# Patient Record
Sex: Male | Born: 1970
Health system: Southern US, Community
[De-identification: ages and names within clinical notes are randomized; demographics above are authoritative.]

## PROBLEM LIST (undated history)

## (undated) DIAGNOSIS — G8929 Other chronic pain: Secondary | ICD-10-CM

## (undated) DIAGNOSIS — R5382 Chronic fatigue, unspecified: Secondary | ICD-10-CM

## (undated) DIAGNOSIS — F419 Anxiety disorder, unspecified: Secondary | ICD-10-CM

## (undated) DIAGNOSIS — I10 Essential (primary) hypertension: Secondary | ICD-10-CM

## (undated) DIAGNOSIS — G4730 Sleep apnea, unspecified: Secondary | ICD-10-CM

## (undated) DIAGNOSIS — R000 Tachycardia, unspecified: Secondary | ICD-10-CM

## (undated) DIAGNOSIS — M5116 Intervertebral disc disorders with radiculopathy, lumbar region: Secondary | ICD-10-CM

## (undated) DIAGNOSIS — Z87442 Personal history of urinary calculi: Secondary | ICD-10-CM

## (undated) DIAGNOSIS — G43909 Migraine, unspecified, not intractable, without status migrainosus: Secondary | ICD-10-CM

## (undated) DIAGNOSIS — E785 Hyperlipidemia, unspecified: Secondary | ICD-10-CM

## (undated) DIAGNOSIS — F331 Major depressive disorder, recurrent, moderate: Secondary | ICD-10-CM

## (undated) DIAGNOSIS — N35919 Unspecified urethral stricture, male, unspecified site: Secondary | ICD-10-CM

## (undated) HISTORY — DX: Anxiety disorder, unspecified: F41.9

## (undated) HISTORY — DX: Other chronic pain: G89.29

## (undated) HISTORY — DX: Morbid (severe) obesity due to excess calories: E66.01

## (undated) HISTORY — PX: OTHER SURGICAL HISTORY: SHX169

## (undated) HISTORY — DX: Intervertebral disc disorders with radiculopathy, lumbar region: M51.16

## (undated) HISTORY — DX: Major depressive disorder, recurrent, moderate: F33.1

---

## 1998-06-14 ENCOUNTER — Encounter: Admission: RE | Admit: 1998-06-14 | Discharge: 1998-07-03 | Payer: Self-pay | Admitting: Anesthesiology

## 1998-10-10 ENCOUNTER — Encounter: Payer: Self-pay | Admitting: Internal Medicine

## 1998-10-10 ENCOUNTER — Emergency Department (HOSPITAL_COMMUNITY): Admission: EM | Admit: 1998-10-10 | Discharge: 1998-10-10 | Payer: Self-pay | Admitting: Internal Medicine

## 1999-06-13 ENCOUNTER — Inpatient Hospital Stay (HOSPITAL_COMMUNITY): Admission: AD | Admit: 1999-06-13 | Discharge: 1999-06-16 | Payer: Self-pay | Admitting: *Deleted

## 1999-06-17 ENCOUNTER — Other Ambulatory Visit: Admission: RE | Admit: 1999-06-17 | Discharge: 1999-06-26 | Payer: Self-pay

## 1999-07-29 ENCOUNTER — Other Ambulatory Visit: Admission: RE | Admit: 1999-07-29 | Discharge: 1999-07-29 | Payer: Self-pay | Admitting: Family Medicine

## 2008-09-10 HISTORY — PX: LUMBAR FUSION: SHX111

## 2009-01-17 ENCOUNTER — Emergency Department: Payer: Self-pay | Admitting: Unknown Physician Specialty

## 2009-01-21 ENCOUNTER — Emergency Department (HOSPITAL_COMMUNITY): Admission: EM | Admit: 2009-01-21 | Discharge: 2009-01-21 | Payer: Self-pay | Admitting: Emergency Medicine

## 2009-01-23 ENCOUNTER — Encounter: Admission: RE | Admit: 2009-01-23 | Discharge: 2009-01-23 | Payer: Self-pay | Admitting: Neurosurgery

## 2009-02-06 ENCOUNTER — Inpatient Hospital Stay (HOSPITAL_COMMUNITY): Admission: RE | Admit: 2009-02-06 | Discharge: 2009-02-09 | Payer: Self-pay | Admitting: Neurosurgery

## 2009-03-28 ENCOUNTER — Ambulatory Visit: Payer: Self-pay | Admitting: Family Medicine

## 2009-03-28 DIAGNOSIS — F331 Major depressive disorder, recurrent, moderate: Secondary | ICD-10-CM

## 2009-04-23 ENCOUNTER — Ambulatory Visit: Payer: Self-pay | Admitting: Family Medicine

## 2009-04-24 LAB — CONVERTED CEMR LAB
Albumin: 3.7 g/dL (ref 3.5–5.2)
BUN: 15 mg/dL (ref 6–23)
Calcium: 9.1 mg/dL (ref 8.4–10.5)
Cholesterol: 187 mg/dL (ref 0–200)
GFR calc non Af Amer: 79.82 mL/min (ref >60)
Glucose, Bld: 86 mg/dL (ref 70–99)
HDL: 33.2 mg/dL — ABNORMAL LOW (ref >39.00)
Sodium: 140 meq/L (ref 135–145)
VLDL: 26 mg/dL (ref 0.0–40.0)

## 2009-05-29 ENCOUNTER — Ambulatory Visit: Payer: Self-pay | Admitting: Family Medicine

## 2009-05-29 DIAGNOSIS — R221 Localized swelling, mass and lump, neck: Secondary | ICD-10-CM

## 2009-05-29 DIAGNOSIS — R220 Localized swelling, mass and lump, head: Secondary | ICD-10-CM | POA: Insufficient documentation

## 2009-05-31 ENCOUNTER — Ambulatory Visit: Payer: Self-pay | Admitting: Cardiology

## 2009-06-02 ENCOUNTER — Encounter: Payer: Self-pay | Admitting: Family Medicine

## 2009-06-03 ENCOUNTER — Telehealth: Payer: Self-pay | Admitting: Family Medicine

## 2009-07-26 ENCOUNTER — Encounter (INDEPENDENT_AMBULATORY_CARE_PROVIDER_SITE_OTHER): Payer: Self-pay | Admitting: *Deleted

## 2009-11-20 ENCOUNTER — Ambulatory Visit: Payer: Self-pay | Admitting: Family Medicine

## 2010-01-15 ENCOUNTER — Telehealth: Payer: Self-pay | Admitting: Family Medicine

## 2010-01-21 ENCOUNTER — Telehealth: Payer: Self-pay | Admitting: Family Medicine

## 2010-02-14 ENCOUNTER — Encounter: Admission: RE | Admit: 2010-02-14 | Discharge: 2010-02-14 | Payer: Self-pay | Admitting: Neurosurgery

## 2010-05-26 IMAGING — RF DG LUMBAR SPINE 2-3V
1 series · 2 of 2 positions shown · non-contrast
Comparison: 01/24/2007

CLINICAL DATA: L5-S1 fusion

LUMBAR SPINE - 2-3 VIEW

[Series 1: run · 2 of 2 slices shown]
[im 1/2]
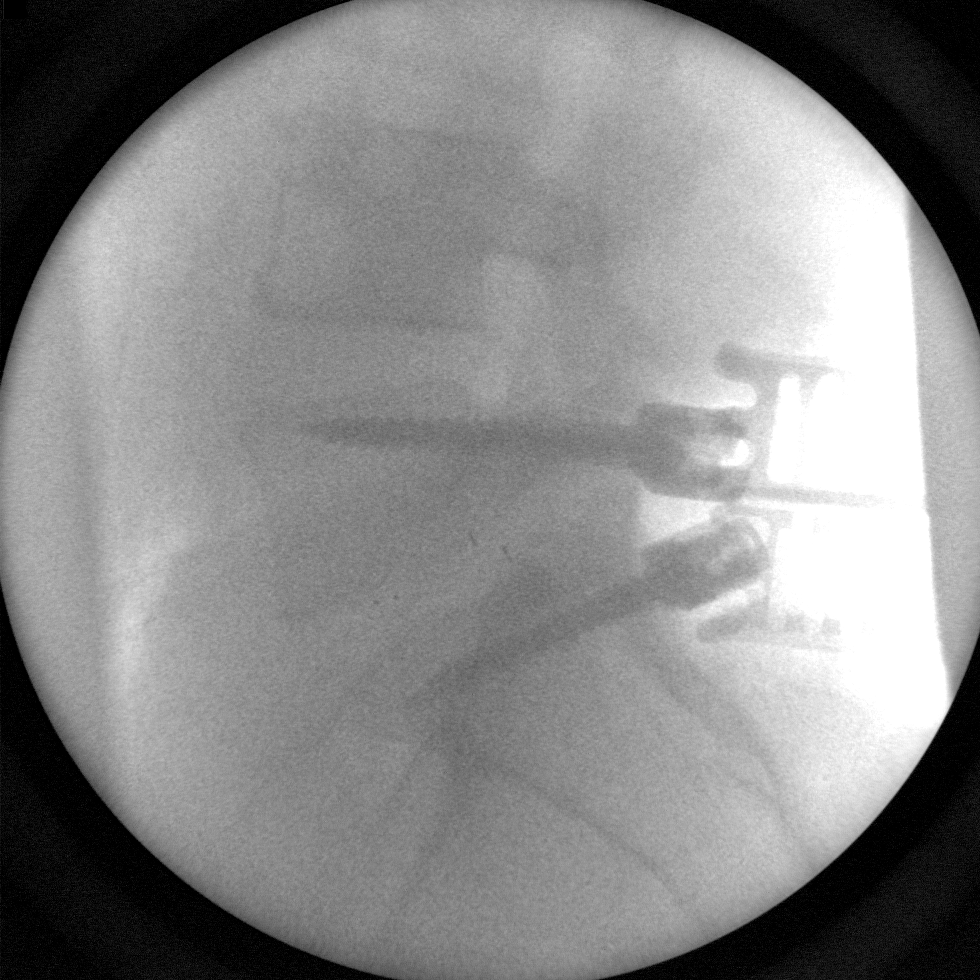
[im 2/2]
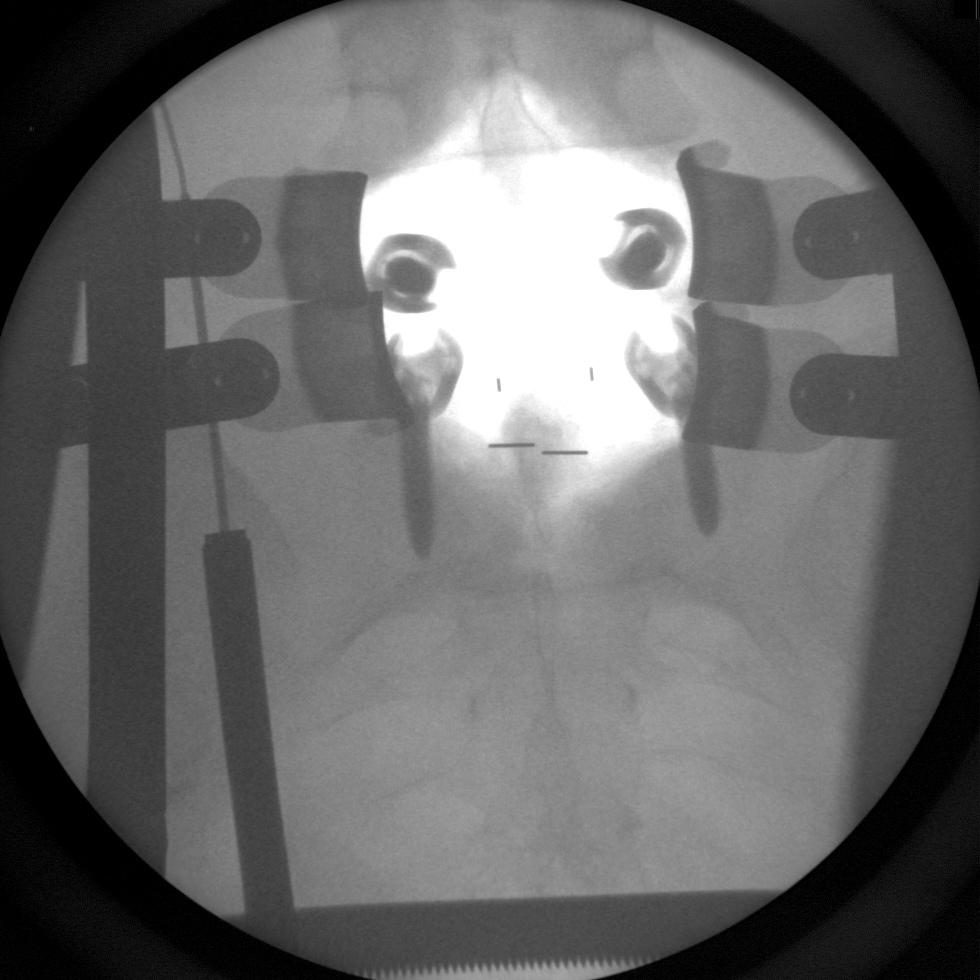

[2 of 2 positions shown; findings below may reference images not displayed]

FINDINGS: Two C-arm images show pedicle screws in place at L5 and
S1.  Interbody fusion material is in place.  Components appear
grossly well positioned.  No detectable complication.
IMPRESSION: PLIF in progress L5-S1.

## 2010-05-26 IMAGING — CR DG LUMBAR SPINE 1V
1 series · 1 of 1 positions shown · non-contrast
Comparison: MRI 01/23/2009

CLINICAL DATA: Localization for spine surgery

LUMBAR SPINE - 1 VIEW

[view not recorded]
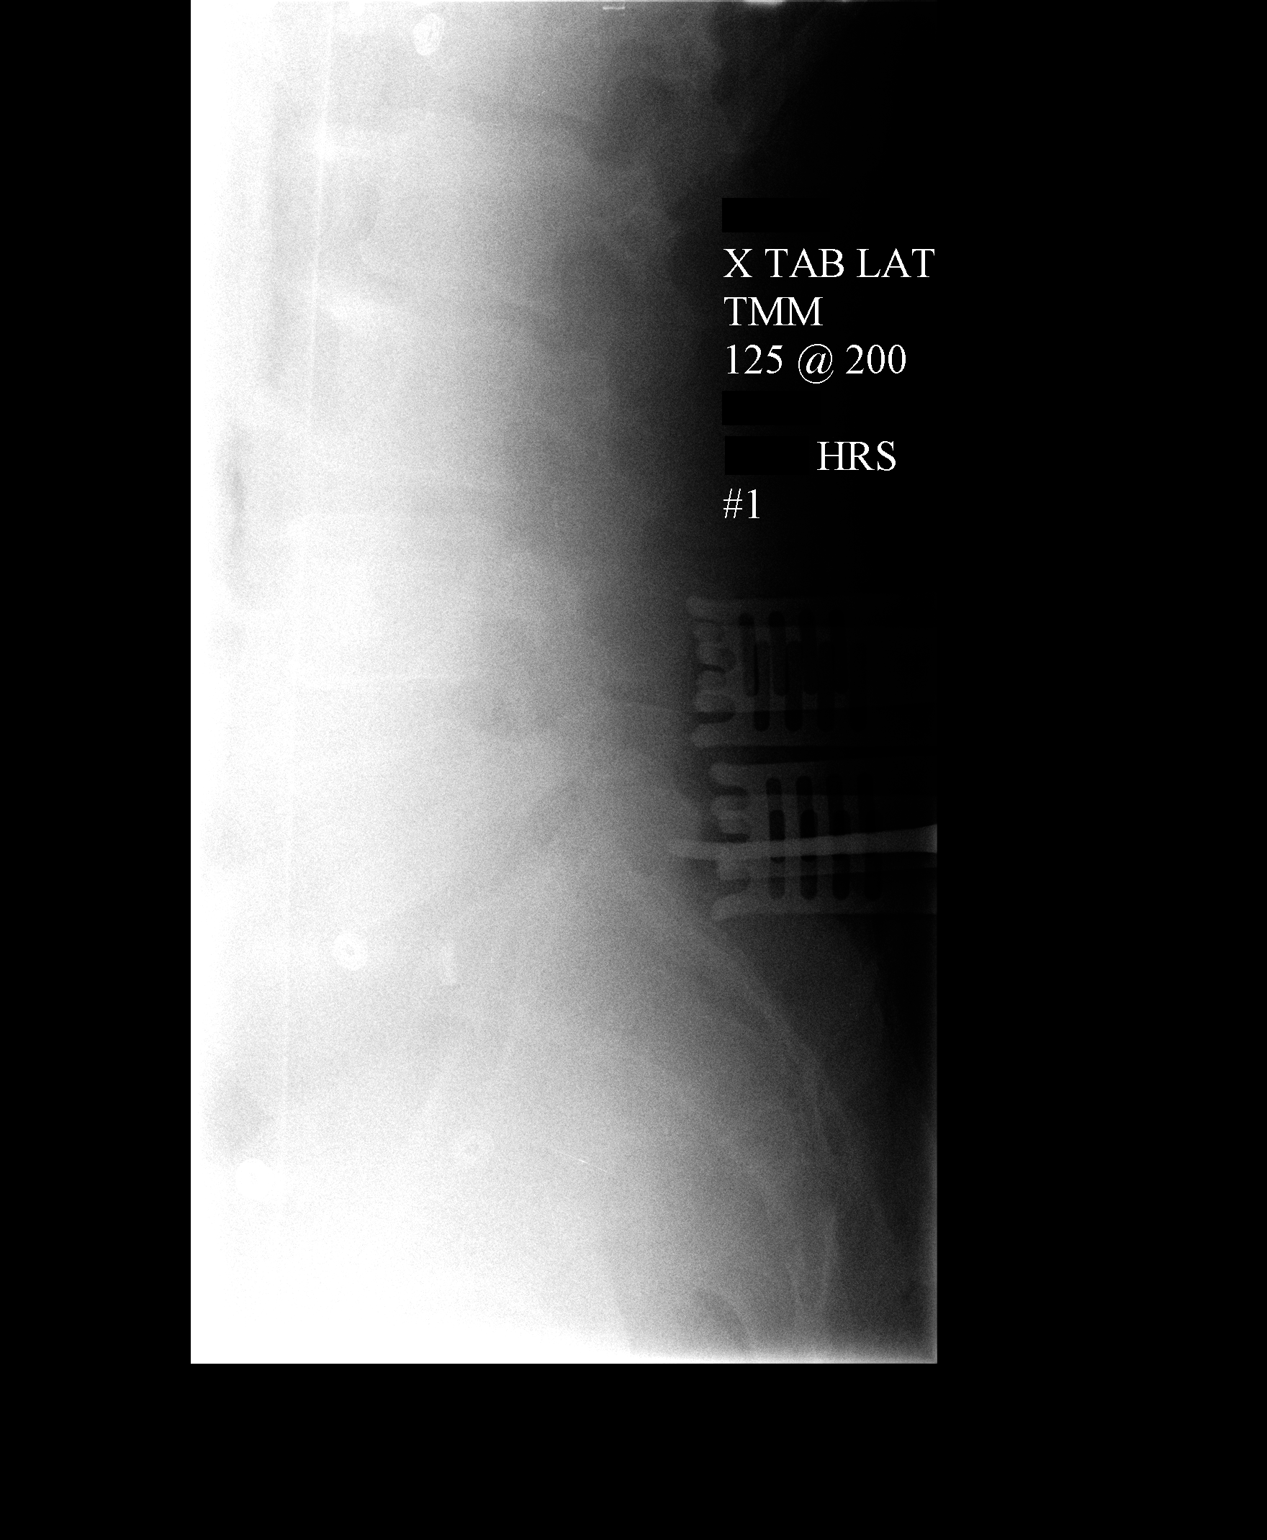

[1 of 1 positions shown; findings below may reference images not displayed]

FINDINGS: There are tissue spreaders in the soft tissues with a
metallic instrument positioned posterior to the neural canal at the
level of L5-S1.
IMPRESSION: L5-S1 localized.

## 2010-07-28 ENCOUNTER — Telehealth: Payer: Self-pay | Admitting: Family Medicine

## 2010-08-04 ENCOUNTER — Encounter: Payer: Self-pay | Admitting: Neurosurgery

## 2010-08-12 NOTE — Letter (Signed)
Summary: [place] No Show Letter  [place] at [facility]  [address] [place], [place] [address]   Phone: [phone]  Fax: [phone]    07/26/2009 MRN: [id number]  Richard Mcguire [address] [place], [place]  [address]   Dear [name],   Our records indicate that you missed your scheduled appointment with ____lab_________________ on ___1.14.11________.  Please contact this office to reschedule your appointment as soon as possible.  It is important that you keep your scheduled appointments with your physician, so we can provide you the best care possible.  Please be advised that there may be a charge for "no show" appointments.    Sincerely,   [place] at [facility]

## 2010-08-12 NOTE — Progress Notes (Signed)
Summary: Rx Paxil  Phone Note Refill Request Call back at [phone] Message from:  [facility] on January 21, 2010 3:22 PM  Refills Requested: Medication #1:  PAXIL 20 MG TABS 1 tab by mouth at bedtime. Received E-script request please advise.   Method Requested: Telephone to Pharmacy Initial call taken by: Linde Gillis CMA Duncan Dull),  January 21, 2010 3:23 PM    Prescriptions: PAXIL 20 MG TABS (PAROXETINE HCL) 1 tab by mouth at bedtime.  #30 x 6   Entered and Authorized by:   Ruthe Mannan MD   Signed by:   Ruthe Mannan MD on 01/22/2010   Method used:   [facility]        [facility] (retail)       [address]       [place], [place]  [address]       Ph: [phone] or [phone]       Fax: [phone]   RxID:   [id number]

## 2010-08-12 NOTE — Progress Notes (Signed)
Summary: Rx Paroxetine  Phone Note Refill Request Call back at [phone] Message from:  [facility] on January 15, 2010 8:41 AM  Refills Requested: Medication #1:  PAXIL 20 MG TABS 1 tab by mouth at bedtime. Received E-script refill request please advise.   Method Requested: Telephone to Pharmacy Initial call taken by: Linde Gillis CMA Duncan Dull),  January 15, 2010 8:43 AM    Prescriptions: PAXIL 20 MG TABS (PAROXETINE HCL) 1 tab by mouth at bedtime.  #30 x 6   Entered and Authorized by:   Ruthe Mannan MD   Signed by:   Ruthe Mannan MD on 01/15/2010   Method used:   [facility]        [facility] [facility]retail)       [address]       [place], [place]  [address]       Ph: [phone] or [phone]       Fax: [phone]   RxID:   [id number]

## 2010-08-12 NOTE — Assessment & Plan Note (Signed)
Summary: discuss diet plans/alc   Vital Signs:  Patient profile:   40 year old male Height:      74 inches Weight:      361.50 pounds BMI:     46.58 Temp:     98.8 degrees F oral Pulse rate:   84 / minute Pulse rhythm:   regular BP sitting:   122 / 88  (left arm) Cuff size:   large  Vitals Entered By: Delilah Shan CMA Duncan Dull) (Nov 20, 2009 10:02 AM) CC: Discuss diet plan   History of Present Illness: 40 yo here to discuss weight loss options.  Weighs the most he has ever weighted (almost 20 pounds than he did last OV in November). Has chronic back issues, neurosurg told him he had to do something about his weight. Over past month, he has started walking on treadmill for 45 minutes 4-5 times per week. Trying to cut back on sodas, limiting portions. Weight NOT coming off.  Getting very discouraged but knows there is no magic pill.  Wants to work hard but if it will yield results.  Current Medications (verified): 1)  Flexeril 10 Mg Tabs (Cyclobenzaprine Hcl) .... Take 1 Tablet By Mouth Three Times A Day 2)  Paxil 20 Mg Tabs (Paroxetine Hcl) .Marland Kitchen.. 1 Tab By Mouth At Bedtime. 3)  Triamcinolone Acetonide 0.1 % Crea (Triamcinolone Acetonide) .... Apply Thin Film To Affected Areas 2-4 Times/day 4)  Multivitamins   Tabs (Multiple Vitamin) .... Take 1 Tablet By Mouth Once A Day 5)  Calcium Citrate-Vitamin D 315-200 Mg-Unit  Tabs (Calcium Citrate-Vitamin D) .... Take 1 Tablet By Mouth Two Times A Day  Allergies (verified): No Known Drug Allergies  Review of Systems      See HPI CV:  Complains of weight gain; denies chest pain or discomfort, difficulty breathing at night, difficulty breathing while lying down, fainting, fatigue, leg cramps with exertion, lightheadness, near fainting, palpitations, shortness of breath with exertion, swelling of feet, and swelling of hands. Psych:  Denies anxiety and depression.  Physical Exam  General:  Well-developed,well-nourished,in no acute  distress; alert,appropriate and cooperative throughout examination, obese Psych:  memory intact for recent and remote, normally interactive, good eye contact, and not anxious appearing.     Impression & Recommendations:  Problem # 1:  MORBID OBESITY (ICD-278.01) Assessment Deteriorated Time spent with patient 25 minutes, more than 50% of this time was spent counseling patient on obesity. He is making very good lifestyle changes. Will refer to surgery for bariatric surgical options.  Orders: Surgical Referral (Surgery)  Complete Medication List: 1)  Flexeril 10 Mg Tabs (Cyclobenzaprine hcl) .... Take 1 tablet by mouth three times a day 2)  Paxil 20 Mg Tabs (Paroxetine hcl) .Marland Kitchen.. 1 tab by mouth at bedtime. 3)  Triamcinolone Acetonide 0.1 % Crea (Triamcinolone acetonide) .... Apply thin film to affected areas 2-4 times/day 4)  Multivitamins Tabs (Multiple vitamin) .... Take 1 tablet by mouth once a day 5)  Calcium Citrate-vitamin D 315-200 Mg-unit Tabs (Calcium citrate-vitamin d) .... Take 1 tablet by mouth two times a day  Patient Instructions: 1)  Please stop by to see Shirlee Limerick on your way out. 2)  Please take Nexium 40 mg daily.  Current Allergies (reviewed today): No known allergies

## 2010-08-14 NOTE — Progress Notes (Signed)
Summary: Paxil refill  Phone Note Refill Request Message from:  [facility] on July 28, 2010 9:42 AM  Refills Requested: Medication #1:  PAXIL 20 MG TABS 1 tab by mouth at bedtime. Recieved request from [facility]. Okay to refill?   Method Requested: Electronic Initial call taken by: Janee Morn CMA Duncan Dull),  July 28, 2010 9:43 AM    Prescriptions: PAXIL 20 MG TABS (PAROXETINE HCL) 1 tab by mouth at bedtime.  #30 x 6   Entered and Authorized by:   Ruthe Mannan MD   Signed by:   Ruthe Mannan MD on 07/28/2010   Method used:   [facility]        [facility] [facility]       [address]       [place], [place]  [address]       Ph: [phone] or [phone]       Fax: [phone]   RxID:   [id number]

## 2010-09-24 ENCOUNTER — Telehealth: Payer: Self-pay | Admitting: Family Medicine

## 2010-09-30 NOTE — Progress Notes (Signed)
Summary: Rx Paxil  Phone Note Refill Request Call back at [phone] Message from:  [facility] on September 24, 2010 11:36 AM  Refills Requested: Medication #1:  PAXIL 20 MG TABS 1 tab by mouth at bedtime.   Last Refilled: 07/28/2010 Received E-script request please advise.   Method Requested: Telephone to Pharmacy Initial call taken by: Linde Gillis CMA Duncan Dull),  September 24, 2010 11:37 AM    Prescriptions: PAXIL 20 MG TABS (PAROXETINE HCL) 1 tab by mouth at bedtime.  #30 x 6   Entered and Authorized by:   Ruthe Mannan MD   Signed by:   Ruthe Mannan MD on 09/24/2010   Method used:   [facility]        [facility]ail)       [address]       [place], [place]  [address]       Ph: [phone] or [phone]       Fax: [phone]   RxID:   [id number]

## 2010-10-19 LAB — TYPE AND SCREEN
ABO/RH(D): A POS
ABO/RH(D): A POS
Antibody Screen: NEGATIVE
Antibody Screen: NEGATIVE

## 2010-10-19 LAB — CBC
HCT: 45 % (ref 39.0–52.0)
Hemoglobin: 15.2 g/dL (ref 13.0–17.0)
MCHC: 33.8 g/dL (ref 30.0–36.0)
MCV: 81.9 fL (ref 78.0–100.0)
Platelets: 199 10*3/uL (ref 150–400)
RBC: 5.49 MIL/uL (ref 4.22–5.81)
RDW: 14 % (ref 11.5–15.5)
WBC: 8.9 10*3/uL (ref 4.0–10.5)

## 2010-10-19 LAB — ABO/RH: ABO/RH(D): A POS

## 2010-11-25 NOTE — Op Note (Signed)
NAMERAYNARD, MAPPS           ACCOUNT NO.:  [id number]   MEDICAL RECORD NO.:  [id number]          PATIENT TYPE:  INP   LOCATION:  3031                         FACILITY:  [facility]   PHYSICIAN:  Hewitt Shorts, M.D.DATE OF BIRTH:  04/04/1971   DATE OF PROCEDURE:  02/06/2009  DATE OF DISCHARGE:                               OPERATIVE REPORT   PREOPERATIVE DIAGNOSES:  Bilateral L5 pars interarticularis defect,  grade 1 spondylolisthesis, bilateral L5-S1 neuroforaminal stenosis, and  broad-based spondylitic L5-S1 lumbar disk herniation.   POSTOPERATIVE DIAGNOSES:  Bilateral L5 pars interarticularis defect,  grade 1 spondylolisthesis, bilateral L5-S1 neuroforaminal stenosis, and  broad-based spondylitic L5-S1 lumbar disk herniation.   PROCEDURE:  L5 Gill procedure with microdissection including  laminectomy, facetectomy, and foraminotomy with decompression of the L5  and S1 nerve roots bilaterally with decompression beyond that required  for interbody arthrodesis, bilateral L5-S1 posterior lumbar interbody  arthrodesis with AVS PEEK interbody implants, and locally harvested  morselized autograft, bilateral L5-S1 posterolateral  arthrodesis with  radius posterior instrumentation, and locally harvested morselized  autograft.   SURGEON:  Hewitt Shorts, MD   ASSISTANTS:  Maeola Harman, M.D..   ANESTHESIA:  General endotracheal.   INDICATIONS:  The patient is a 40 year old man who presented with low  back and bilateral lumbar radicular pain.  He was found to have a broad-  based spondylitic disk herniation at L5-S1 with bilateral L5 pars  interarticularis defect and grade I spondylolisthesis.  Decision was  made to proceed with decompression and stabilization.  Notably, his BMI  is 46.   PROCEDURE:  The patient was brought to the operating room, placed under  general endotracheal anesthesia.  The nursing staff as well as myself  attempted to place a Foley catheter, this was  unsuccessful.  We had to  consult Dr. Jethro Bolus intraoperatively.  He was found to have a  meatal stricture, this was treated by Dr. Patsi Sears and a Foley  catheter was placed by Dr. Patsi Sears.   Once this was completed, the patient was turned to prone position and  then the lumbar region was prepped with Betadine soap and solution and  draped in a sterile fashion.  The midline was infiltrated with local  anesthetic with epinephrine.  A midline incision was made, carried down  through the subcutaneous tissue.  Bipolar electrocautery was used to  maintain hemostasis.  Dissection was carried down to the lumbar fascia,  which was incised bilaterally and the paraspinal muscles were dissected  from the spinous process and lamina in a subperiosteal fashion.  A self-  retaining retractor was placed and x-ray taken and the L5-S1  intralaminar space was identified as was the L5 spinous process and the  lamina.   Using magnification with microdissection and microsurgery technique, we  proceeded with decompression.  Dissection was carried out laterally and  the posterior elements of L5 was found to be loose we began to free it  from the ligamentous attachments.  The L5 facet was drilled away  bilaterally and we were able to progressively mobilize the posterior  element and then it was removed en bloc.  It was subsequently cleaned  off soft tissue and then it was morselized in a bone mill and later used  for the bone graft material both for the interbody fusion and for the  posterolateral arthrodesis.   We then began the decompression of the neuroforamen.  There was  significant pseudoarthrosis bilaterally.  This was removed in a  piecemeal fashion.  We identified the exiting L5 nerve root and  gradually unroofed the spondylitic overgrowth and decompressed the  dorsal aspect of the nerve roots.   We then explored the epidural space coagulating and dividing epidural  veins and  identified the broad-based spondylitic disk herniation at L5-  S1 as well as defining the spondylolisthesis at L5 and S1.   We incised the annulus and entered into the disk space and proceeded  with a diskectomy using variety of microcurettes and pituitary rongeurs.  There was a large spondylitic ridge of the posterior and inferior aspect  of the L5 and extended across the entire posterior aspect of L5.  This  was removed using an osteophyte removal tool and particular care was  taken to remove this ridge laterally within the neuroforamen to  decompress the ventral and inferior aspect of the exiting L5 nerve root  bilaterally.   We then began to prepare the interbody space for arthrodesis.  There was  significant spondylitic degeneration within the disk space.  The disk  material was removed.  We then used paddle curettes to remove the  cartilaginous endplate surfaces and we sized the space with disk space  sizers and selected a 24 mm depth, 10 mm height, 4 degree of lordosis  AVS PEEK interbody implant.  The local bone graft that we had harvested  was morselized in the bone mill and then packed into the AVS implant.  We first placed the implant on the right side, gently retracted the  thecal sac and nerve root.  It was tamped into position and countersunk  and then went to the left side.  Placed the second implant and again  retracted the thecal sac and nerve root and gently tamped into position.  We then took additional morselized autograft and packed it in the  interbody space lateral to each of the implants.   The C-arm fluoroscope was then brought into the field to provide  guidance and placement of the pedicle screws.  We identified the L5 and  S1 pedicle entry sites.  Each of the 4 pedicles was probed using C-arm  fluoroscopic guidance.  Exam with a ball probe, good bony surface was  found.  Each of the pedicles were tapped with a 5.25-mm tap.  Again was  examined with a ball  probe, good threading was noted, good bony surfaces  were noted, and then we placed 5.75-mm screws at each level using 50-mm  screws at L5 and 40-mm screws at S1.   We then selected prelordosed rods using a 35-mm rod on the right and 30-  mm rod on the left.  The rods were placed within the screw heads and  locking caps placed.  Once all the 4 locking caps were in place, final  tightening was done using a counter torque.  We then packed the  morselized autograft in the lateral gutters overlying the facets and  transverse process and ala, which will be decorticated and then the  wound was irrigated once again with Bacitracin solution, checked for  hemostasis which was established and confirmed, and then proceeded with  closure.  Deep fascia was closed with interrupted undyed #1 Vicryl  sutures.  Scarpa fascia was closed with interrupted undyed #1 Vicryl  sutures.  The subcutaneous and subcuticular were closed with interrupted  inverted 2-0 undyed Vicryl sutures.  The upper edge of the incision was  closed with 3-0 nylon interrupted horizontal mattress sutures and the  remainder of the incision was closed with Dermabond.  The wound was  dressed with Adaptic and sterile gauze.  Procedure was  tolerated well.  The estimated blood loss was 600 mL and we were able to  return 230 mL of Cell Saver blood to the patient.  Sponge and needle  count were correct.  Following surgery, the patient was turned back to  the supine position, to be reversed from the anesthetic, extubated, and  transferred to the recovery room for further care.      Hewitt Shorts, M.D.  Electronically Signed     RWN/MEDQ  D:  02/06/2009  T:  02/07/2009  Job:  [id number]

## 2010-11-25 NOTE — Op Note (Signed)
NAMETERRY, BOLOTIN           ACCOUNT NO.:  [id number]   MEDICAL RECORD NO.:  [id number]          PATIENT TYPE:  INP   LOCATION:  3031                         FACILITY:  [facility]   PHYSICIAN:  Sigmund I. Patsi Sears, M.D.DATE OF BIRTH:  11-20-1970   DATE OF PROCEDURE:  02/06/2009  DATE OF DISCHARGE:                               OPERATIVE REPORT   PREOPERATIVE DIAGNOSIS:  Urethral meatal stricture.   POSTOPERATIVE DIAGNOSIS:  Urethral meatal stricture.   OPERATION:  Urethral dilation, a 12-French Foley catheter insertion.   SURGEON:  Sigmund I. Patsi Sears, MD   ANESTHESIA:  General endotracheal.   PROCEDURE:  With the patient in the supine position under general  anesthesia, intraoperative Urology consultation was required because  Foley catheter could not be inserted.  Inspection of the penile meatus  showed that the patient had circumferential meatal penile scarring, and  evidence of prior meatoplasty (? as a child).  A 14-French or 12-French  catheter could not be passed into the meatus.  Therefore, urethral  meatal dilation was accomplished to a size 16-French with the [employer]  sound.  Following this, a 12-French Foley catheter was easily passed  into the urethral meatus and into the bladder.  Clear urine was  obtained.  There was some bleeding noted at the level of the submeatus  of the urethra.  It was felt to be inconsequential.  The patient will be  followed up with full consultation postoperatively.      Sigmund I. Patsi Sears, M.D.  Electronically Signed     SIT/MEDQ  D:  02/06/2009  T:  02/06/2009  Job:  [id number]   cc:   Hewitt Shorts, M.D.

## 2010-11-25 NOTE — H&P (Signed)
Richard Mcguire, Richard Mcguire NO.:  [id number]   MEDICAL RECORD NO.:  [id number]          PATIENT TYPE:  INP   LOCATION:  3031                         FACILITY:  [facility]   PHYSICIAN:  Hewitt Shorts, M.D.DATE OF BIRTH:  02/21/1971   DATE OF ADMISSION:  02/06/2009  DATE OF DISCHARGE:                              HISTORY & PHYSICAL   HISTORY OF PRESENT ILLNESS:  The patient is a 40 year old right-handed  white male who is evaluated for disabling low back and right lumbar  radicular pain into the buttock and thigh with numbness in his toes.  He  explains to have his symptoms began January 19, 2009, he was climbing into  the cab of his truck and sneezed.  He was seen in the emergency room at  [facility], subsequently in the [facility] Emergency Room, at that point referred to our office.  He was  treated now at [facility] with Vicodin and Flexeril and at [facility] with Valium and Percocet with a 6-day prednisone dose pack.  X-  rays have been done at the [facility] Emergency Room, but no additional  workup.  We subsequently evaluated the patient with MRI scan, which  shows bilateral L5 pars interarticularis defect with a grade 1  spondylolisthesis of the L5 and S1 with associated pseudoarthritis and  broad-based spondylotic disk protrusion and bilateral neuroforaminal  stenosis.  Because of the persistent and disabling pain, decision was  made to proceed with decompression and arthrodesis, and the patient is  admitted for such.   PAST MEDICAL HISTORY:  The patient did not describe a history for  hypertension, myocardial infarction, cancer, stroke, diabetes, peptic  ulcer disease or lung disease.   PREVIOUS SURGERIES:  Pilonidal cyst in 1990.   MEDICATIONS:  His only medications are those prescribed by the emergency  room and he denies allergies to medications.   FAMILY HISTORY:  Parents are both living.  His mother is age 83 in  good  health with chronic back pain.  Father is age 65 in good health.   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  He  smokes a pack a day.  He drinks alcoholic beverages.  Socially, he  denies history of substance abuse.   REVIEW OF SYSTEMS:  Notable for those described in his history of  present illness and past medical history,  but is otherwise  unremarkable.   PHYSICAL EXAMINATION:  GENERAL:  The patient is a morbidly obese, white  male in obvious discomfort, but in no acute distress.  VITAL SIGNS:  Height is 6 feet, weight 240 pounds for BMI of 45.  Temperature 98.1, pulse 72, blood pressure 133/88, respiratory rate 20.  LUNGS:  Clear to auscultation.  He has symmetrical respiratory  excursion.  HEART:  Regular rate and rhythm.  Normal S1 and S2.  There is no murmur.  EXTREMITIES:  Moderate clubbing, but no cyanosis or edema.  MUSCULOSKELETAL:  Significant limitation in mobility due to discomfort  pain in the low back.  Flexion is limited about 20 degrees and  is unable  to extend to any significant extent.  Tenderness diffusely in the lumbar  region particularly to the right side but without specific point  tenderness.  NEUROLOGIC:  A 5/5 strength in the lower extremities including the  iliopsoas, quadriceps, dorsiflexors, extensor hallucis longus, and  plantar flexor bilaterally.  He tends to give way although on testing of  the musculature on the right side, sensation is intact to pinprick to  the upper and lower extremities.  Reflexes are 1 at the quadriceps,  minimal in the gastrocnemius, symmetrical bilaterally.  Toes are  downgoing bilaterally.  His gait exams both favor the right lower  extremity due to pain.  He has a positive straight leg raising on the  right and negative on the left.   IMPRESSION:  Incapacitating low back and bilateral lower extremity pain  which is improved somewhat with prednisone dose pack.  The patient's  bilateral  L5 pars interarticularis defect with broad-based spondylotic  disk herniation and bilateral neuroforaminal stenosis and significant  pseudoarthrosis with grade 1 spondylolisthesis at L5 and S1.  He has had  symptoms dating back as much as 11 years ago with acute symptoms  developing earlier this month.   PLAN:  The patient admitted for L5 Gill procedure in bilateral L5-S1,  posterior lumbar interbody fusion with interbody implants and bone graft  and bilateral L5-S1 posterolateral arthrodesis with posterior  instrumentation and bone graft.  We discussed alternatives to surgery  and nature of the surgical procedure itself, typical length of the  surgery, hospital stay, overall occupational limitations  postoperatively, need for postoperative immobilization and lumbar brace  and risks including risks of infection, bleeding, possible need for  transfusion, risk of nerve dysfunction with pain, weakness, numbness, or  paresthesias, the risk of dural tear and CSF leakage, possible need for  further surgery, the risk of failure of the arthrodesis and anesthetic  risk of myocardial infarction, stroke, pneumonia, and death.  He was  strongly advised to quit smoking and to work on weight loss.   Understanding all this, he did wish to proceed with surgery and was  admitted for such.      Hewitt Shorts, M.D.  Electronically Signed     RWN/MEDQ  D:  02/06/2009  T:  02/07/2009  Job:  [id number]

## 2010-11-25 NOTE — Discharge Summary (Signed)
Richard Mcguire, Richard Mcguire           ACCOUNT NO.:  [id number]   MEDICAL RECORD NO.:  [id number]          PATIENT TYPE:  INP   LOCATION:  3031                         FACILITY:  [facility]   PHYSICIAN:  Hilda Lias, M.D.   DATE OF BIRTH:  [birth year]   DATE OF ADMISSION:  02/06/2009  DATE OF DISCHARGE:  02/09/2009                               DISCHARGE SUMMARY   ADMISSION DIAGNOSES:  L5-S1 spondylolisthesis with chronic  radiculopathy, obesity.   FINAL DIAGNOSES:  L5-S1 spondylolisthesis with chronic radiculopathy,  obesity.   The patient was admitted because of back pain with radiation to both  legs.  The pain is getting worse.  X-ray showed decreased flexibility of  the lumbar spine with some weakness.  X-rays show a spondylolisthesis at  L5-S1.   LABORATORY DATA:  Normal.   HOSPITAL COURSE:  The patient was taken to surgery, and L5-S1 diskectomy  and fusion was done by Dr. Newell Coral.  Today, he is doing much better.  He is ambulating and is ready to go home.   CONDITION ON DISCHARGE:  Stable.   MEDICATIONS:  Percocet, Flexeril.   DIET:  He was encouraged to lose weight.   ACTIVITY:  Not to bend, not to drive.   FOLLOWUP:  He will be seen by Dr. Newell Coral in 4 weeks.           ______________________________  Hilda Lias, M.D.     EB/MEDQ  D:  02/09/2009  T:  02/10/2009  Job:  [id number]

## 2011-04-02 ENCOUNTER — Other Ambulatory Visit: Payer: Self-pay | Admitting: *Deleted

## 2011-04-02 NOTE — Telephone Encounter (Signed)
Received faxed refill request, medication was not listed on med list.  Patients last OV was on 11/20/2009.  Please advise.

## 2011-04-02 NOTE — Telephone Encounter (Signed)
Ok to refill one month but pt needs to be seen for additional refills. Thank you.

## 2011-04-03 MED ORDER — PAROXETINE HCL 20 MG PO TABS: 20.0000 mg | ORAL_TABLET | Freq: Every day | ORAL | Status: DC

## 2011-04-03 NOTE — Telephone Encounter (Signed)
Rx called to [facility].  Left message advising pharmacist that patient needs to call our office to schedule f/u appt before any further refills will be given.

## 2011-11-17 ENCOUNTER — Other Ambulatory Visit: Payer: Self-pay | Admitting: Neurosurgery

## 2011-11-17 DIAGNOSIS — M545 Low back pain: Secondary | ICD-10-CM

## 2011-11-17 DIAGNOSIS — M47817 Spondylosis without myelopathy or radiculopathy, lumbosacral region: Secondary | ICD-10-CM

## 2011-11-17 DIAGNOSIS — IMO0002 Reserved for concepts with insufficient information to code with codable children: Secondary | ICD-10-CM

## 2011-11-26 ENCOUNTER — Other Ambulatory Visit: Payer: Self-pay

## 2011-12-01 ENCOUNTER — Ambulatory Visit
Admission: RE | Admit: 2011-12-01 | Discharge: 2011-12-01 | Disposition: A | Discharge status: Home / Self Care | Payer: Medicaid Other | Source: Ambulatory Visit | Attending: Neurosurgery | Admitting: Neurosurgery

## 2011-12-01 DIAGNOSIS — M47817 Spondylosis without myelopathy or radiculopathy, lumbosacral region: Secondary | ICD-10-CM

## 2011-12-01 DIAGNOSIS — IMO0002 Reserved for concepts with insufficient information to code with codable children: Secondary | ICD-10-CM

## 2011-12-01 DIAGNOSIS — M545 Low back pain: Secondary | ICD-10-CM

## 2011-12-01 MED ORDER — GADOBENATE DIMEGLUMINE 529 MG/ML IV SOLN
20.0000 mL | Freq: Once | INTRAVENOUS | Status: AC | PRN
  Administered 2011-12-01: 20 mL via INTRAVENOUS

## 2011-12-01 MED ORDER — IOHEXOL 300 MG/ML  SOLN: 125.0000 mL | Freq: Once | INTRAMUSCULAR | Status: AC | PRN

## 2012-07-15 ENCOUNTER — Ambulatory Visit (INDEPENDENT_AMBULATORY_CARE_PROVIDER_SITE_OTHER): Payer: Medicare Other | Admitting: Family Medicine

## 2012-07-15 ENCOUNTER — Encounter: Payer: Self-pay | Admitting: Family Medicine

## 2012-07-15 VITALS — BP 110/72 | HR 120 | Temp 98.0°F | Ht 73.0 in | Wt 369.0 lb

## 2012-07-15 DIAGNOSIS — Z23 Encounter for immunization: Secondary | ICD-10-CM

## 2012-07-15 DIAGNOSIS — R000 Tachycardia, unspecified: Secondary | ICD-10-CM

## 2012-07-15 DIAGNOSIS — I10 Essential (primary) hypertension: Secondary | ICD-10-CM

## 2012-07-15 LAB — CBC WITH DIFFERENTIAL/PLATELET
Basophils Absolute: 0.1 10*3/uL (ref 0.0–0.1)
Eosinophils Absolute: 0.5 10*3/uL (ref 0.0–0.7)
Lymphs Abs: 2.7 10*3/uL (ref 0.7–4.0)
MCH: 25.4 pg — ABNORMAL LOW (ref 26.0–34.0)
Neutrophils Relative %: 76 % (ref 43–77)
Platelets: 308 10*3/uL (ref 150–400)
RBC: 5.12 MIL/uL (ref 4.22–5.81)
RDW: 15.5 % (ref 11.5–15.5)
WBC: 15.4 10*3/uL — ABNORMAL HIGH (ref 4.0–10.5)

## 2012-07-15 LAB — COMPREHENSIVE METABOLIC PANEL WITH GFR
ALT: 18 U/L (ref 0–53)
AST: 16 U/L (ref 0–37)
Albumin: 3.8 g/dL (ref 3.5–5.2)
Alkaline Phosphatase: 128 U/L — ABNORMAL HIGH (ref 39–117)
BUN: 14 mg/dL (ref 6–23)
CO2: 27 meq/L (ref 19–32)
Calcium: 9 mg/dL (ref 8.4–10.5)
Chloride: 103 meq/L (ref 96–112)
Creat: 1 mg/dL (ref 0.50–1.35)
Glucose, Bld: 129 mg/dL — ABNORMAL HIGH (ref 70–99)
Potassium: 4 meq/L (ref 3.5–5.3)
Sodium: 141 meq/L (ref 135–145)
Total Bilirubin: 0.4 mg/dL (ref 0.3–1.2)
Total Protein: 6.5 g/dL (ref 6.0–8.3)

## 2012-07-15 LAB — LDL CHOLESTEROL, DIRECT: Direct LDL: 134 mg/dL — ABNORMAL HIGH

## 2012-07-15 MED ORDER — METOPROLOL SUCCINATE 12.5 MG HALF TABLET: 12.5000 mg | ORAL_TABLET | Freq: Every day | ORAL | Status: DC

## 2012-07-15 MED ORDER — TRAMADOL HCL 50 MG PO TABS: 100.0000 mg | ORAL_TABLET | Freq: Three times a day (TID) | ORAL | Status: DC | PRN

## 2012-07-15 MED ORDER — LISINOPRIL 20 MG PO TABS: 10.0000 mg | ORAL_TABLET | Freq: Every day | ORAL | Status: DC

## 2012-07-15 MED ORDER — PAROXETINE HCL 20 MG PO TABS: 20.0000 mg | ORAL_TABLET | Freq: Every day | ORAL | Status: DC

## 2012-07-15 MED ORDER — LISINOPRIL 20 MG PO TABS: 20.0000 mg | ORAL_TABLET | Freq: Every day | ORAL | Status: DC

## 2012-07-15 NOTE — Patient Instructions (Addendum)
It was good to see you again.  We are cutting your lisinopril to 10 mg daily and adding Toprol 12.5 mg daily. As discussed today, I do want you to see a heart doctor.  Please come see me in 2 weeks for a follow up of your blood pressure.  We will call you with your lab results.

## 2012-07-15 NOTE — Addendum Note (Signed)
Addended by: Eliezer Bottom on: 07/15/2012 04:48 PM   Modules accepted: Orders

## 2012-07-15 NOTE — Progress Notes (Signed)
42  yo male here to  Re establish care.   Once he went on disability for his back pain, he went on Medicaid and starting going to [facility].  Has not been here in several years.  Very Tachycardic today- denies any CP or SOB but has had "heart fluttering."  He is in significant pain- h/o L5 fusion (per pt, "didn't take."). States he is in pain constantly.  Takes flexeril and tramadol and feels they take the edge off but nothing takes away the pain.  States that Dr. Jule Ser has stated he is not a candidate for further surgeries (awaiting records).  Tobacco abuse- quit smoking in 3/10 before his spinal surgery and has not restarted.    Morbidly obese-weight has increased since we saw him in 2011.  States he cannot exercise due to his back pain and his pain worsens in cold weather.  Has thought about gastric surgery but states he know that he cannot stick to lifestyle changes- he does not like to eat vegetables.  He does feel his depression and anxiety is much better controlled with paxil.  Patient Active Problem List  Diagnosis  . MORBID OBESITY  . MAJOR DPRSV DISORDER RECURRENT EPISODE MODERATE  . SWELLING MASS OR LUMP IN HEAD AND NECK  . Tachycardia   Past Medical History  Diagnosis Date  . Routine general medical examination at a health care facility   . Major depressive disorder, recurrent episode, moderate   . Morbid obesity   . Screening for lipoid disorders   . Swelling, mass, or lump in head and neck   . Chronic pain   . Anxiety   . Lumbar disc disease with radiculopathy     sees Dr. Jule Ser   Past Surgical History  Procedure Date  . Lumbar fusion 09/2008    Dr. Newell Coral   History  Substance Use Topics  . Smoking status: Former Games developer  . Smokeless tobacco: Not on file  . Alcohol Use: Yes     Comment: Occassional beer.   Family History  Problem Relation Age of Onset  . Hypertension Mother   . Hypertension Father   . Cancer Paternal Grandfather     lung    No Known Allergies Current Outpatient Prescriptions on File Prior to Visit  Medication Sig Dispense Refill  . lisinopril (PRINIVIL,ZESTRIL) 20 MG tablet Take 1 tablet (20 mg total) by mouth daily.  30 tablet  11  . PARoxetine (PAXIL) 20 MG tablet Take 1 tablet (20 mg total) by mouth at bedtime.  30 tablet  11   The PMH, PSH, Social History, Family History, Medications, and allergies have been reviewed in [facility], and have been updated if relevant.   Review of Systems  See HPI   No CP No SOB Physical Exam  BP 110/72  Pulse 120  Temp 98 F (36.7 C)  Ht 6\' 1"  (1.854 m)  Wt 369 lb (167.377 kg)  BMI 48.68 kg/m2 Wt Readings from Last 3 Encounters:  07/15/12 369 lb (167.377 kg)  11/20/09 361 lb 8 oz (163.975 kg)  05/29/09 348 lb 8 oz (158.079 kg)     General: morbidly obese, Well-developed,well-nourished,in no acute distress; alert,appropriate and cooperative throughout examination, obese  Lungs: Normal respiratory effort, chest expands symmetrically. Lungs are clear to auscultation, no crackles or wheezes.  Heart: tachycardic, . S1 and S2 normal without gallop, murmur, click, rub or other extra sounds.  Extremities: No clubbing, cyanosis, edema, or deformity noted with normal full range of motion of  all joints.  Psych: Oriented X3, memory intact for recent and remote, normally interactive, good eye contact, and depressed affect.   Assessment and Plan: 1. Tachycardia  New- improved during office visit but still remained elevated at 111. EKG reassuring- sinus tachycardia.  Given his risk factors- morbid obesity, h/o tobacco abuse I did urge cardiology referral further work up/holter monitor.  Pt would like to defer cardiology referral at this time.  Will decrease lisinopril to 10 mg daily and add toprol xl 12.5 mg daily for tachycardia.  Follow up in 2 weeks.  He is aware that he needs to go straight to ER if he does develop CP.  Lipid panel today for further risk stratification.  Comprehensive metabolic panel, CBC with Differential, EKG 12-Lead  2. Morbid obesity  Deteriorated but unwilling to discuss options currently. LDL Cholesterol, Direct   3.  HTN- Well controlled but given tachycardia, will add betablocker and decrease dose of lisinopril.

## 2012-07-29 ENCOUNTER — Ambulatory Visit (INDEPENDENT_AMBULATORY_CARE_PROVIDER_SITE_OTHER): Payer: Medicare Other | Admitting: Family Medicine

## 2012-07-29 ENCOUNTER — Encounter: Payer: Self-pay | Admitting: Family Medicine

## 2012-07-29 VITALS — BP 110/70 | HR 80 | Resp 18 | Wt 368.0 lb

## 2012-07-29 DIAGNOSIS — R000 Tachycardia, unspecified: Secondary | ICD-10-CM

## 2012-07-29 DIAGNOSIS — I10 Essential (primary) hypertension: Secondary | ICD-10-CM

## 2012-07-29 DIAGNOSIS — D72829 Elevated white blood cell count, unspecified: Secondary | ICD-10-CM | POA: Insufficient documentation

## 2012-07-29 LAB — CBC WITH DIFFERENTIAL/PLATELET
Basophils Absolute: 0 10*3/uL (ref 0.0–0.1)
Eosinophils Absolute: 0.3 10*3/uL (ref 0.0–0.7)
Hemoglobin: 13.1 g/dL (ref 13.0–17.0)
Lymphocytes Relative: 19.3 % (ref 12.0–46.0)
Monocytes Relative: 5.9 % (ref 3.0–12.0)
Neutro Abs: 8.5 10*3/uL — ABNORMAL HIGH (ref 1.4–7.7)
Neutrophils Relative %: 72 % (ref 43.0–77.0)
Platelets: 276 10*3/uL (ref 150.0–400.0)
RDW: 15.3 % — ABNORMAL HIGH (ref 11.5–14.6)

## 2012-07-29 NOTE — Patient Instructions (Addendum)
Good to see you. Your pulse and blood pressure are excellent.  I will call you with your lab results.

## 2012-07-29 NOTE — Progress Notes (Signed)
42  yo male here for two week follow up.    HTN- two weeks ago at his new patient appointment, he was quite tachycardic. Eventually HR decreased a little but still was 111 at it's lowest point during office visit.  He denied any CP or SOB but did endorse "heart fluttering."  EKG showed sinus tachycardia and he refused cardiology referral- has multiple cardiac risk factors- morbid obesity, h/o previous long term tobacco use. Lab Results  Component Value Date   CHOL 187 04/23/2009   HDL 33.20* 04/23/2009   LDLCALC 128* 04/23/2009   LDLDIRECT 134* 07/15/2012   TRIG 130.0 04/23/2009   CHOLHDL 6 04/23/2009     He also has constant pain in back and legs (chronic).    We decreased his lisinopril to 10 mg daily and added toprol xl 12.5 mg daily for tachycardia.   Pulse is much improved! Richard Mcguire has had no side effects and denies any recurrent "fluttering."  CBC was abnormal with leukocytosis.  He denied feeling ill or recent infection but had recently received his influenza vaccination. Lab Results  Component Value Date   WBC 15.4* 07/15/2012   HGB 13.0 07/15/2012   HCT 39.1 07/15/2012   MCV 76.4* 07/15/2012   PLT 308 07/15/2012      Patient Active Problem List  Diagnosis  . MORBID OBESITY  . MAJOR DPRSV DISORDER RECURRENT EPISODE MODERATE  . SWELLING MASS OR LUMP IN HEAD AND NECK  . Tachycardia  . HTN (hypertension)  . Leukocytosis, unspecified   Past Medical History  Diagnosis Date  . Routine general medical examination at a health care facility   . Major depressive disorder, recurrent episode, moderate   . Morbid obesity   . Screening for lipoid disorders   . Swelling, mass, or lump in head and neck   . Chronic pain   . Anxiety   . Lumbar disc disease with radiculopathy     sees Dr. Jule Ser   Past Surgical History  Procedure Date  . Lumbar fusion 09/2008    Dr. Newell Coral   History  Substance Use Topics  . Smoking status: Former Games developer  . Smokeless tobacco: Not on  file  . Alcohol Use: Yes     Comment: Occassional beer.   Family History  Problem Relation Age of Onset  . Hypertension Mother   . Hypertension Father   . Cancer Paternal Grandfather     lung   No Known Allergies Current Outpatient Prescriptions on File Prior to Visit  Medication Sig Dispense Refill  . cyclobenzaprine (FLEXERIL) 10 MG tablet Take 10 mg by mouth 2 (two) times daily as needed.      Marland Kitchen lisinopril (PRINIVIL,ZESTRIL) 20 MG tablet Take 0.5 tablets (10 mg total) by mouth daily.  30 tablet  11  . metoprolol succinate (TOPROL-XL) 12.5 mg TB24 Take 0.5 tablets (12.5 mg total) by mouth daily.  30 tablet  3  . Multiple Vitamin (MULTIVITAMIN) tablet Take 1 tablet by mouth daily.      . naproxen sodium (ANAPROX) 220 MG tablet Take 2 by mouth twice a day 3 to 5 days a week      . PARoxetine (PAXIL) 20 MG tablet Take 1 tablet (20 mg total) by mouth at bedtime.  30 tablet  11  . traMADol (ULTRAM) 50 MG tablet Take 2 tablets (100 mg total) by mouth every 8 (eight) hours as needed for pain.  30 tablet  11   The PMH, PSH, Social History, Family History,  Medications, and allergies have been reviewed in [facility], and have been updated if relevant.   Review of Systems  See HPI   No CP No SOB Physical Exam  There were no vitals taken for this visit. Wt Readings from Last 3 Encounters:  07/15/12 369 lb (167.377 kg)  11/20/09 361 lb 8 oz (163.975 kg)  05/29/09 348 lb 8 oz (158.079 kg)     General: morbidly obese, Well-developed,well-nourished,in no acute distress; alert,appropriate and cooperative throughout examination, obese  Lungs: Normal respiratory effort, chest expands symmetrically. Lungs are clear to auscultation, no crackles or wheezes.  Heart: Normal rate! S1 and S2 normal without gallop, murmur, click, rub or other extra sounds.  Extremities: No clubbing, cyanosis, edema, or deformity noted with normal full range of motion of all joints.  Psych: Oriented X3, memory intact for  recent and remote, normally interactive, good eye contact, and depressed affect.   Assessment and Plan: 1. Tachycardia  Resolved with addition of b blocker.  Continue current meds.  He will let me know if heart fluttering returns and would possible agree to cardiology referral at that time.     2. Leukocytosis Recheck CBC.  I suspect it is reactive but given that he is relatively immobile due to chronic pain, will check D dimer.  Also repeat CBC today.    3.  HTN- Stable on current meds.

## 2012-07-30 LAB — D-DIMER, QUANTITATIVE: D-Dimer, Quant: 1.29 ug/mL-FEU — ABNORMAL HIGH (ref 0.00–0.48)

## 2012-07-31 ENCOUNTER — Other Ambulatory Visit: Payer: Self-pay | Admitting: Family Medicine

## 2012-07-31 DIAGNOSIS — R7989 Other specified abnormal findings of blood chemistry: Secondary | ICD-10-CM

## 2012-08-01 ENCOUNTER — Ambulatory Visit
Admission: RE | Admit: 2012-08-01 | Discharge: 2012-08-01 | Disposition: A | Discharge status: Home / Self Care | Payer: Medicare Other | Source: Ambulatory Visit | Attending: Family Medicine | Admitting: Family Medicine

## 2012-08-01 DIAGNOSIS — R7989 Other specified abnormal findings of blood chemistry: Secondary | ICD-10-CM

## 2012-08-01 MED ORDER — IOHEXOL 350 MG/ML SOLN
125.0000 mL | Freq: Once | INTRAVENOUS | Status: AC | PRN
  Administered 2012-08-01: 125 mL via INTRAVENOUS

## 2012-08-08 ENCOUNTER — Other Ambulatory Visit: Payer: Self-pay | Admitting: Family Medicine

## 2012-11-07 ENCOUNTER — Other Ambulatory Visit: Payer: Self-pay | Admitting: Family Medicine

## 2013-02-01 ENCOUNTER — Other Ambulatory Visit: Payer: Self-pay | Admitting: Family Medicine

## 2013-03-14 ENCOUNTER — Other Ambulatory Visit: Payer: Self-pay | Admitting: Family Medicine

## 2013-03-14 NOTE — Telephone Encounter (Signed)
[facility] 

## 2013-04-13 ENCOUNTER — Other Ambulatory Visit: Payer: Self-pay | Admitting: Family Medicine

## 2013-05-13 HISTORY — PX: CARDIAC CATHETERIZATION: SHX172

## 2013-06-12 ENCOUNTER — Encounter (HOSPITAL_COMMUNITY): Payer: Self-pay | Admitting: Emergency Medicine

## 2013-06-12 ENCOUNTER — Telehealth: Payer: Self-pay | Admitting: Family Medicine

## 2013-06-12 ENCOUNTER — Emergency Department (HOSPITAL_COMMUNITY): Payer: Medicare Other

## 2013-06-12 ENCOUNTER — Observation Stay (HOSPITAL_COMMUNITY)
Admission: EM | Admit: 2013-06-12 | Discharge: 2013-06-13 | Disposition: A | Discharge status: Home / Self Care | Payer: Medicare Other | Attending: Cardiology | Admitting: Cardiology

## 2013-06-12 DIAGNOSIS — R5382 Chronic fatigue, unspecified: Secondary | ICD-10-CM | POA: Diagnosis present

## 2013-06-12 DIAGNOSIS — R072 Precordial pain: Secondary | ICD-10-CM

## 2013-06-12 DIAGNOSIS — R0789 Other chest pain: Principal | ICD-10-CM | POA: Insufficient documentation

## 2013-06-12 DIAGNOSIS — R000 Tachycardia, unspecified: Secondary | ICD-10-CM | POA: Insufficient documentation

## 2013-06-12 DIAGNOSIS — I10 Essential (primary) hypertension: Secondary | ICD-10-CM | POA: Insufficient documentation

## 2013-06-12 DIAGNOSIS — I2510 Atherosclerotic heart disease of native coronary artery without angina pectoris: Secondary | ICD-10-CM | POA: Insufficient documentation

## 2013-06-12 DIAGNOSIS — Z6841 Body Mass Index (BMI) 40.0 and over, adult: Secondary | ICD-10-CM | POA: Insufficient documentation

## 2013-06-12 DIAGNOSIS — R079 Chest pain, unspecified: Secondary | ICD-10-CM | POA: Diagnosis present

## 2013-06-12 DIAGNOSIS — Z87891 Personal history of nicotine dependence: Secondary | ICD-10-CM | POA: Insufficient documentation

## 2013-06-12 DIAGNOSIS — R319 Hematuria, unspecified: Secondary | ICD-10-CM | POA: Diagnosis present

## 2013-06-12 DIAGNOSIS — N35919 Unspecified urethral stricture, male, unspecified site: Secondary | ICD-10-CM | POA: Insufficient documentation

## 2013-06-12 DIAGNOSIS — R5381 Other malaise: Secondary | ICD-10-CM | POA: Insufficient documentation

## 2013-06-12 DIAGNOSIS — D72829 Elevated white blood cell count, unspecified: Secondary | ICD-10-CM | POA: Diagnosis present

## 2013-06-12 DIAGNOSIS — F331 Major depressive disorder, recurrent, moderate: Secondary | ICD-10-CM | POA: Insufficient documentation

## 2013-06-12 HISTORY — DX: Unspecified urethral stricture, male, unspecified site: N35.919

## 2013-06-12 HISTORY — DX: Essential (primary) hypertension: I10

## 2013-06-12 HISTORY — DX: Migraine, unspecified, not intractable, without status migrainosus: G43.909

## 2013-06-12 HISTORY — DX: Tachycardia, unspecified: R00.0

## 2013-06-12 HISTORY — DX: Chronic fatigue, unspecified: R53.82

## 2013-06-12 HISTORY — DX: Hyperlipidemia, unspecified: E78.5

## 2013-06-12 LAB — TSH: TSH: 0.461 u[IU]/mL (ref 0.350–4.500)

## 2013-06-12 LAB — CBC
MCH: 25.4 pg — ABNORMAL LOW (ref 26.0–34.0)
MCHC: 31.9 g/dL (ref 30.0–36.0)
Platelets: 263 10*3/uL (ref 150–400)
RBC: 5.11 MIL/uL (ref 4.22–5.81)
RDW: 15 % (ref 11.5–15.5)

## 2013-06-12 LAB — GLUCOSE, CAPILLARY: Glucose-Capillary: 93 mg/dL (ref 70–99)

## 2013-06-12 LAB — HEPATIC FUNCTION PANEL
AST: 30 U/L (ref 0–37)
Bilirubin, Direct: 0.1 mg/dL (ref 0.0–0.3)
Indirect Bilirubin: 0.3 mg/dL (ref 0.3–0.9)
Total Protein: 6.6 g/dL (ref 6.0–8.3)

## 2013-06-12 LAB — POCT I-STAT TROPONIN I: Troponin i, poc: 0 ng/mL (ref 0.00–0.08)

## 2013-06-12 LAB — BASIC METABOLIC PANEL
CO2: 25 mEq/L (ref 19–32)
Calcium: 9.2 mg/dL (ref 8.4–10.5)
GFR calc Af Amer: >90 mL/min (ref >90)
GFR calc non Af Amer: >90 mL/min (ref >90)
Glucose, Bld: 97 mg/dL (ref 70–99)
Potassium: 4.1 mEq/L (ref 3.5–5.1)
Sodium: 139 mEq/L (ref 135–145)

## 2013-06-12 LAB — TROPONIN I
Troponin I: <0.3 ng/mL (ref <0.30)
Troponin I: <0.3 ng/mL (ref <0.30)

## 2013-06-12 LAB — LIPASE, BLOOD: Lipase: 23 U/L (ref 11–59)

## 2013-06-12 MED ORDER — CYCLOBENZAPRINE HCL 10 MG PO TABS
10.0000 mg | ORAL_TABLET | Freq: Two times a day (BID) | ORAL | Status: DC | PRN
  Filled 2013-06-12: qty 1

## 2013-06-12 MED ORDER — TRAMADOL HCL 50 MG PO TABS
100.0000 mg | ORAL_TABLET | Freq: Four times a day (QID) | ORAL | Status: DC | PRN
  Administered 2013-06-12: 100 mg via ORAL
  Filled 2013-06-12: qty 2

## 2013-06-12 MED ORDER — SODIUM CHLORIDE 0.9 % IJ SOLN
3.0000 mL | Freq: Two times a day (BID) | INTRAMUSCULAR | Status: DC
  Administered 2013-06-12: 3 mL via INTRAVENOUS

## 2013-06-12 MED ORDER — PERFLUTREN LIPID MICROSPHERE
1.0000 mL | INTRAVENOUS | Status: AC | PRN
  Administered 2013-06-12: 2 mL via INTRAVENOUS
  Filled 2013-06-12: qty 10

## 2013-06-12 MED ORDER — ADULT MULTIVITAMIN W/MINERALS CH
1.0000 | ORAL_TABLET | Freq: Every day | ORAL | Status: DC
  Administered 2013-06-13: 1 via ORAL
  Filled 2013-06-12: qty 1

## 2013-06-12 MED ORDER — LISINOPRIL 10 MG PO TABS
10.0000 mg | ORAL_TABLET | Freq: Every day | ORAL | Status: DC
  Administered 2013-06-13: 10 mg via ORAL
  Filled 2013-06-12: qty 1

## 2013-06-12 MED ORDER — PAROXETINE HCL 20 MG PO TABS
20.0000 mg | ORAL_TABLET | Freq: Every day | ORAL | Status: DC
  Administered 2013-06-13: 20 mg via ORAL
  Filled 2013-06-12: qty 1

## 2013-06-12 MED ORDER — MORPHINE SULFATE 2 MG/ML IJ SOLN
2.0000 mg | Freq: Once | INTRAMUSCULAR | Status: AC
  Administered 2013-06-12: 2 mg via INTRAVENOUS
  Filled 2013-06-12: qty 1

## 2013-06-12 MED ORDER — ASPIRIN EC 81 MG PO TBEC
81.0000 mg | DELAYED_RELEASE_TABLET | Freq: Every day | ORAL | Status: DC
  Administered 2013-06-13: 81 mg via ORAL
  Filled 2013-06-12: qty 1

## 2013-06-12 MED ORDER — ONE-DAILY MULTI VITAMINS PO TABS: 1.0000 | ORAL_TABLET | Freq: Every day | ORAL | Status: DC

## 2013-06-12 MED ORDER — METOPROLOL SUCCINATE 12.5 MG HALF TABLET: 12.5000 mg | ORAL_TABLET | Freq: Every day | ORAL | Status: DC

## 2013-06-12 MED ORDER — HEPARIN SODIUM (PORCINE) 5000 UNIT/ML IJ SOLN
5000.0000 [IU] | Freq: Three times a day (TID) | INTRAMUSCULAR | Status: DC
  Administered 2013-06-12: 5000 [IU] via SUBCUTANEOUS
  Filled 2013-06-12 (×6): qty 1

## 2013-06-12 NOTE — ED Notes (Signed)
Admit Doctor at bedside.  

## 2013-06-12 NOTE — ED Notes (Signed)
EKG completed and given to EDP.  

## 2013-06-12 NOTE — ED Provider Notes (Signed)
CSN: [id number]     Arrival date & time 06/12/13  1039 History   First MD Initiated Contact with Patient 06/12/13 1052     Chief Complaint  Patient presents with  . Chest Pain   (Consider location/radiation/quality/duration/timing/severity/associated sxs/prior Treatment) HPI Comments: Patient is a 42 year old male with history of hypertension, tachycardia, chronic pain who presents today with chest pain. The pain has been intermittent for the past 48 hours. When the pain comes on it lasts approximately 30 minutes and self limited. He describes the pain as if someone is "driving a truck through his chest". After the pain resolves he still has residual chest tightness. He reports that he has not been short of breath, but takes in "controlled, deep breaths". He has not had pain like this in the past. He denies any heart disease. No associated nausea, shortness of breath, diaphoresis.  The history is provided by the patient. No language interpreter was used.    Past Medical History  Diagnosis Date  . Routine general medical examination at a health care facility   . Major depressive disorder, recurrent episode, moderate   . Morbid obesity   . Screening for lipoid disorders   . Swelling, mass, or lump in head and neck   . Chronic pain   . Anxiety   . Lumbar disc disease with radiculopathy     sees Dr. Jule Ser   Past Surgical History  Procedure Laterality Date  . Lumbar fusion  09/2008    Dr. Newell Coral   Family History  Problem Relation Age of Onset  . Hypertension Mother   . Hypertension Father   . Cancer Paternal Grandfather     lung   History  Substance Use Topics  . Smoking status: Former Games developer  . Smokeless tobacco: Not on file  . Alcohol Use: Yes     Comment: Occassional beer.    Review of Systems  Constitutional: Negative for fever, chills and diaphoresis.  Respiratory: Positive for chest tightness. Negative for shortness of breath.   Cardiovascular: Positive for chest  pain.  Gastrointestinal: Negative for nausea, vomiting and abdominal pain.  All other systems reviewed and are negative.    Allergies  Review of patient's allergies indicates no known allergies.  Home Medications   Current Outpatient Rx  Name  Route  Sig  Dispense  Refill  . cyclobenzaprine (FLEXERIL) 10 MG tablet   Oral   Take 10 mg by mouth 2 (two) times daily as needed.         Marland Kitchen lisinopril (PRINIVIL,ZESTRIL) 20 MG tablet   Oral   Take 0.5 tablets (10 mg total) by mouth daily.   30 tablet   11   . metoprolol succinate (TOPROL-XL) 25 MG 24 hr tablet      TAKE 0.5 TABLETS (12.5 MG TOTAL) BY MOUTH DAILY.   30 tablet   2   . Multiple Vitamin (MULTIVITAMIN) tablet   Oral   Take 1 tablet by mouth daily.         . naproxen sodium (ANAPROX) 220 MG tablet      Take 2 by mouth twice a day 3 to 5 days a week         . PARoxetine (PAXIL) 20 MG tablet      TAKE 1 TABLET BY MOUTH ONCE A DAY   30 tablet   2   . PARoxetine (PAXIL) 20 MG tablet      TAKE 1 TABLET BY MOUTH ONCE A DAY   30  tablet   2   . traMADol (ULTRAM) 50 MG tablet      TAKE 2 TABLETS (100 MG TOTAL) BY MOUTH EVERY 8 (EIGHT) HOURS AS NEEDED FOR PAIN.   30 tablet   0    BP 103/62  Pulse 82  Temp(Src) 98.8 F (37.1 C)  Resp 18  SpO2 96% Physical Exam  Nursing note and vitals reviewed. Constitutional: He is oriented to person, place, and time. He appears well-developed and well-nourished. No distress.  obese  HENT:  Head: Normocephalic and atraumatic.  Right Ear: External ear normal.  Left Ear: External ear normal.  Nose: Nose normal.  Eyes: Conjunctivae are normal.  Neck: Normal range of motion. No tracheal deviation present.  Cardiovascular: Normal rate, regular rhythm and normal heart sounds.   Pulmonary/Chest: Effort normal and breath sounds normal. No stridor.  Abdominal: Soft. He exhibits no distension. There is no tenderness.  Musculoskeletal: Normal range of motion.   Neurological: He is alert and oriented to person, place, and time.  Skin: Skin is warm and dry. He is not diaphoretic.  Psychiatric: He has a normal mood and affect. His behavior is normal.    ED Course  Procedures (including critical care time) Labs Review Labs Reviewed  CBC - Abnormal; Notable for the following:    MCH 25.4 (*)    All other components within normal limits  BASIC METABOLIC PANEL  TROPONIN I  TSH  POCT I-STAT TROPONIN I   Imaging Review Dg Chest Port 1 View  06/12/2013   CLINICAL DATA:  Chest pain, tachycardia  EXAM: PORTABLE CHEST - 1 VIEW  COMPARISON:  08/01/2012  FINDINGS: The heart size and mediastinal contours are within normal limits. Both lungs are clear. The visualized skeletal structures are unremarkable.  IMPRESSION: No active disease.   Electronically Signed   By: Ruel Favors M.D.   On: 06/12/2013 11:14    EKG Interpretation    Date/Time:  Monday June 12 2013 10:40:47 EST Ventricular Rate:  90 PR Interval:  155 QRS Duration: 81 QT Interval:  338 QTC Calculation: 413 R Axis:   52 Text Interpretation:  Sinus rhythm Normal ECG Confirmed by Bebe Shaggy  MD, DONALD ([id number]) on 06/12/2013 10:51:05 AM            MDM  No diagnosis found. Concern for cardiac etiology of Chest Pain. Cardiology has been consulted and will see patient in the ED for likely admit. Pt does not meet criteria for CP protocol and a further evaluation is recommended. Pt has been re-evaluated prior to consult and VSS, NAD, heart RRR, pain 0/10, lungs CTAB. No acute abnormalities found on EKG and first round of cardiac enzymes negative. This case was discussed with Dr. Bebe Shaggy who has seen the patient and agrees with plan to admit.      Mora Bellman, PA-C 06/12/13 1510

## 2013-06-12 NOTE — Telephone Encounter (Signed)
Patient Information:  Caller Name: Romolo  Phone: [phone]  Patient: Richard Mcguire, Richard Mcguire  Gender: Male  DOB: 08-17-1970  Age: 42 Years  PCP: Ruthe Mann[facility])  Office Follow Up:  Does the office need to follow up with this patient?: No  Instructions For The Office: N/A  RN Note:  Initially, planned to have wife drive to ED; reviewed urgency for chest pain and reasons for immediate 911 care since he's been having intermittent chest pains for > 48 hours.  Verbalized understanding for calling 911.  Symptoms  Reason For Call & Symptoms: Substernal chest pressure now;  chest pain lasted > 10 minutes last night, 06/11/13,  Also had chest pain 06/10/13. On BP meds, obese, hx of tachycardia per Epic.  Reviewed Health History In EMR: Yes  Reviewed Medications In EMR: Yes  Reviewed Allergies In EMR: Yes  Reviewed Surgeries / Procedures: N/A  Date of Onset of Symptoms: 06/10/2013  Treatments Tried: remain calm, slow breathing down  Treatments Tried Worked: No  Guideline(s) Used:  Chest Pain  Disposition Per Guideline:   Call EMS 911 Now  Reason For Disposition Reached:   Chest pain lasting longer than 5 minutes and ANY of the following:  Over 38 years old Over 80 years old and at least one cardiac risk factor (i.e., high blood pressure, diabetes, high cholesterol, obesity, smoker or strong family history of heart disease) Pain is crushing, pressure-like, or heavy  Took nitroglycerin and chest pain was not relieved History of heart disease (i.e., angina, heart attack, bypass surgery, angioplasty, CHF)  Advice Given:  N/A  Patient Will Follow Care Advice:  YES

## 2013-06-12 NOTE — H&P (Signed)
Cardiology Consultation Note  Patient ID: Richard Mcguire, MRN: [id number], DOB/AGE: 04-11-1971 42 y.o. Admit date: 06/12/2013   Date of Consult: 06/12/2013 Primary Physician: Ruthe Mannan, MD Primary Cardiologist: New. Being seen by Dr. Antoine Poche  Chief Complaint: CP Reason for Consult: CP  HPI: Richard Mcguire is a 42 y/o M with no prior cardiac history but a history of HTN, 30 yrs former tobacco abuse, morbid obesity, chronic pain/fatigue, migraines who presented to [facility] today with 2-day history of CP. On Saturday evening he was resting in his usual state of health watching the football game. He developed substernal chest pressure described as pain/tightness. It began to get progressively worse until he turned the TV/lights off and laid down in bed. The pain lasted about 30 minutes but he still had a residual tightness. He was afraid the pain would recur so only slept on/off that night. He spent the day Sunday (yesterday) resting without significant symptoms. He awoke from dozing around 12:30am again with chest discomfort that lasted 5-10 minutes, but the tightness again persisted for several hours until 4:30am when he was finally able to fall back asleep. Nothing made the pain worse including palpation, inspiration or movement. When he called his PCP's office today around 9am to request an appointment to discuss his CP, he was advised to call 911. He was given ASA. He also received SL NTG which eased his chest pressure somewhat. He was given morphine in the ED with resolution of pain. He denies any radiation of pain but does state that the underside of his arm felt sharp (however, due to his disc issues sometimes wakes up with numb hands if he lays on that particular side). He denies any nausea, vomiting, diaphoresis, syncope. He occasionally feels a palpitation "fluttering" but nothing sustained and this has occurred with and without CP in the past. He was evaluated for elevated HR in  07/2012 with a normal CT angio and was placed on Toprol by his PCP. He does not exercise. However, when he is walking around in [place], he sometimes has to use the cart because he can get significant back pain and diaphoresis when doing so. He has mild exertional dyspnea but not changed lately. No exertional CP.   He also endorses intermittent hematuria including an episode this AM. He has not been evaluated for this issue. No recent travel, bedrest, injury. He is not hypoxic, tachycardic or tachypnic in the ED. Labwork unremarkable and CXR WNL. EKG unremarkable.  Past Medical History  Diagnosis Date  . Major depressive disorder, recurrent episode, moderate   . Morbid obesity   . Chronic pain   . Anxiety   . Lumbar disc disease with radiculopathy     sees Dr. Jule Ser  . HTN (hypertension)   . Tachycardia     a. 07/2012 - placed on BB. CT angio neg.  . Migraine headache   . Chronic fatigue   . Kidney stone   . Urethral stricture     Was told he would need pediatric cath if ever had urinary cath      Most Recent Cardiac Studies: None   Surgical History:  Past Surgical History  Procedure Laterality Date  . Lumbar fusion  09/2008    Dr. Newell Coral  . Fatty tumor removal       Home Meds: Prior to Admission medications   Medication Sig Start Date End Date Taking? Authorizing Provider  cyclobenzaprine (FLEXERIL) 10 MG tablet Take 10 mg by mouth 2 (two)  times daily as needed for muscle spasms.    Yes Historical Provider, MD  lisinopril (PRINIVIL,ZESTRIL) 20 MG tablet Take 0.5 tablets (10 mg total) by mouth daily. 07/15/12  Yes Dianne Dun, MD  metoprolol succinate (TOPROL-XL) 25 MG 24 hr tablet TAKE 0.5 TABLETS (12.5 MG TOTAL) BY MOUTH DAILY. 03/14/13  Yes Dianne Dun, MD  Multiple Vitamin (MULTIVITAMIN) tablet Take 1 tablet by mouth daily.   Yes Historical Provider, MD  naproxen sodium (ANAPROX) 220 MG tablet Take 2 by mouth twice a day 3 to 5 days a week   Yes Historical Provider, MD    OVER THE COUNTER MEDICATION Take 2 tablets by mouth at bedtime as needed (for pain/sleep. Takes Aleve PM).   Yes Historical Provider, MD  PARoxetine (PAXIL) 20 MG tablet Take 20 mg by mouth daily.   Yes Historical Provider, MD  Pseudoephedrine-APAP-DM (DAYQUIL PO) Take 2 capsules by mouth every 6 (six) hours as needed (for cold symptoms).   Yes Historical Provider, MD  traMADol (ULTRAM) 50 MG tablet Take 100 mg by mouth every 6 (six) hours as needed for moderate pain.   Yes Historical Provider, MD    Inpatient Medications:     Allergies: No Known Allergies  History   Social History  . Marital Status: Married    Spouse Name: N/A    Number of Children: 1  . Years of Education: N/A   Occupational History  . disabled     former truck Hospital doctor   Social History Main Topics  . Smoking status: Former Smoker -- 30 years  . Smokeless tobacco: Not on file     Comment: 1/2 ppd-2ppd - quit 2010.  Marland Kitchen Alcohol Use: Yes     Comment: Occassional beer. 1-2/yr  . Drug Use: No  . Sexual Activity: Not on file   Other Topics Concern  . Not on file   Social History Narrative   Lives with wife and daughter.  Daughter has multiple problems, including chronic fatigue syndrome.     Family History  Problem Relation Age of Onset  . Hypertension Mother   . Cancer Paternal Grandfather     lung  . CAD Maternal Grandmother     age 47 - CABG.  . Fibromyalgia      Sister and daughter     Review of Systems: General: negative for chills, fever, night sweats Cardiovascular: negative for edema, orthopnea but doesn't typically lie flat Dermatological: negative for rash Respiratory: negative for cough or wheezing Urologic: positive hematuria - has periodically. Abdominal: negative for nausea, vomiting, diarrhea, bright red blood per rectum, melena, or hematemesis Neurologic: negative for visual changes, syncope, or dizziness Denies any stroke, ministroke. No h/o GI bleeding. All other systems  reviewed and are otherwise negative except as noted above.  Labs:  Lab Results  Component Value Date   WBC 9.9 06/12/2013   HGB 13.0 06/12/2013   HCT 40.7 06/12/2013   MCV 79.6 06/12/2013   PLT 263 06/12/2013    Recent Labs Lab 06/12/13 1112  NA 139  K 4.1  CL 103  CO2 25  BUN 13  CREATININE 0.80  CALCIUM 9.2  GLUCOSE 97   Lab Results  Component Value Date   CHOL 187 04/23/2009   HDL 33.20* 04/23/2009   LDLCALC 128* 04/23/2009   TRIG 130.0 04/23/2009   Radiology/Studies:  Dg Chest Port 1 View 06/12/2013   CLINICAL DATA:  Chest pain, tachycardia  EXAM: PORTABLE CHEST - 1 VIEW  COMPARISON:  08/01/2012  FINDINGS: The heart size and mediastinal contours are within normal limits. Both lungs are clear. The visualized skeletal structures are unremarkable.  IMPRESSION: No active disease.   Electronically Signed   By: Ruel Favors M.D.   On: 06/12/2013 11:14   EKG: NSR 90bpm no acute ST-T changes  Physical Exam: Blood pressure 105/66, pulse 83, temperature 98.8 F (37.1 C), resp. rate 16, SpO2 100.00%. General: Well developed, obese WM in no acute distress. Head: Normocephalic, atraumatic, sclera non-icteric, no xanthomas, nares are without discharge.  Neck: Negative for carotid bruits. JVD not elevated. Lungs: Clear bilaterally to auscultation without wheezes, rales, or rhonchi. Breathing is unlabored. Heart: RRR with S1 S2. No murmurs, rubs, or gallops appreciated. Abdomen: Soft, non-tender, non-distended with normoactive bowel sounds. No hepatomegaly. No rebound/guarding. No obvious abdominal masses. Msk:  Strength and tone appear normal for age. Extremities: No clubbing or cyanosis. No edema.  Distal pedal pulses are 2+ and equal bilaterally. Neuro: Alert and oriented X 3. No facial asymmetry. No focal deficit. Moves all extremities spontaneously. Psych:  Responds to questions appropriately with a normal affect.   Assessment and Plan:   1. Chest pain - typical and atypical  features. No objective evidence of ischemia thus far but pressure was partially NTG-responsive and he has risk factors of former tobacco, HTN, and obesity. Unfortunately his weight limits options for a quality noninvasive evaluation. He is unable to exercise on treadmill due to orthopedic issues. Plan dobutamine echo in AM if he rules out. Will ask echo tech to let us know if windows are adequate. If they are not, will need to proceed with cath to exclude CAD. If he rules in, will need cath. Hold off on heparin unless troponins turn positive. Check lipids. 2. Morbid obesity - last BMI 48.6 - weight loss will be essential to his future health. 3. HTN, controlled - cont home regimen. 4. Former tobacco abuse - congratulated on cessation. 5. Hematuria - check UA. Needs to f/u PCP for this given history of smoking. Not anemic. 6. Chronic fatigue and snoring - recommend sleep study at discharge.  Signed, Dayna Dunn PA-C 06/12/2013, 1:48 PM   History and all data above reviewed.  Patient examined.  I agree with the findings as above.  Chest pain at rest.  No objective evidence of ischemia.  However, he does have cardiovascular risk factors.  The patient exam reveals COR:RRR  ,  Lungs: Clear  ,  Abd: Positive bowel sounds, no rebound no guarding, Ext No edema  .  All available labs, radiology testing, previous records reviewed. Agree with documented assessment and plan. Chest pain.  Need to observe overnight and cycle cardiac enzymes.  I would prefer a noninvasive study.  However, he is morbidly obese and imaging might be difficult.  I will try to order a dobutamine echocardiogram if he has adequate acoustic windows.  However, he will need a cath if we cannot see his endocardium with echo.   Fayrene Fearing Allister Lessley  3:16 PM  06/12/2013

## 2013-06-12 NOTE — Significant Event (Signed)
Cardiology has admitted patient, and do not require [facility] assistance.  Appreciate input into this gentleman's care. [facility] to relinquish care to Cards.  Pleas Koch, MD [facility] [phone]

## 2013-06-12 NOTE — Progress Notes (Signed)
  Echocardiogram 2D Echocardiogram with Definity has been performed.  Bradan Congrove 06/12/2013, 4:53 PM

## 2013-06-12 NOTE — Telephone Encounter (Signed)
Noted. plz call later today for update

## 2013-06-12 NOTE — H&P (Signed)
[name] History and Physical  Richard Mcguire:[id number] DOB: 03-29-1971 DOA: 06/12/2013  Referring physician: Bebe Shaggy PCP: Ruthe Mannan, MD  Specialists: Cardiology  Chief Complaint: Chest pain  HPI: Richard Mcguire is a 42 y.o. male with history of chronic tachycardia on beta blocker [unknown etiology],Morbid obesity, There is no weight on file to calculate BMI., depression/anxiety [situational-has a grandshild in NICU], hypertension, possible OHS who started having chest pain 06/10/13 He states the pain was central chest in character, felt like a pressure over the anterior chest wall, had no radiating features and is not relieved by anything. The pain started in the early morning of 11/28 and awoke him from his sleep. He states that the pain then became worse and worse throughout the day and he did not tell his wife for or relatives that he was having pain. He eventually went to sleep on 11/29 and woke up with mild pain on 11/30.  Again the pain increased he in the anterior chest wall and felt like a pressure, it was more on lying down that he had this pain. He is been already worked up in the remote past for pulmonary embolism by Dr. Dayton Martes when he was diagnosed with a chronic tachycardia and CT angiogram at that time was negative He called the office of his primary care physician this morning, and was directed to come over to the emergency room. He states the nitroglycerin helped his anterior chest pain but gave him a headache. This brought his pain down from 4/10 to a 2/10.  He was given IV morphine and his current pain is a 2/10 in the anterior chest.  He received aspirin 325 mg in EMS truck    Review of Systems: The patient  denies-fever chills cough sputum syncope lower extremity edema fall to one side weakness on one side of the body, dyspnea, dyspnea on exertion paroxysmal nocturnal dyspnea Patient does have anterior chest pain, states he has lower extremity pain  secondary to back surgery 2010 and therefore has limitations as to how much he can do  Past Medical History  Diagnosis Date  . Major depressive disorder, recurrent episode, moderate   . Morbid obesity   . Chronic pain   . Anxiety   . Lumbar disc disease with radiculopathy     sees Dr. Jule Ser  . HTN (hypertension)   . Tachycardia     a. 07/2012 - placed on BB. CT angio neg.  . Migraine headache   . Chronic fatigue   . Kidney stone   . Urethral stricture     Was told he would need pediatric cath if ever had urinary cath   Past Surgical History  Procedure Laterality Date  . Lumbar fusion  09/2008    Dr. Newell Coral  . Fatty tumor removal     Social History:  reports that he has quit smoking. He does not have any smokeless tobacco history on file. He reports that he drinks alcohol. He reports that he does not use illicit drugs. Lives at home Retired since 2010/disabled because of his back surgery Multiple psychosocial stressors  No Known Allergies  Family History  Problem Relation Age of Onset  . Hypertension Mother   . Cancer Paternal Grandfather     lung  . CAD Maternal Grandmother     age 61 - CABG.  . Fibromyalgia      Sister and daughter     Prior to Admission medications   Medication Sig Start Date End Date  Taking? Authorizing Provider  cyclobenzaprine (FLEXERIL) 10 MG tablet Take 10 mg by mouth 2 (two) times daily as needed for muscle spasms.    Yes Historical Provider, MD  lisinopril (PRINIVIL,ZESTRIL) 20 MG tablet Take 0.5 tablets (10 mg total) by mouth daily. 07/15/12  Yes Dianne Dun, MD  metoprolol succinate (TOPROL-XL) 25 MG 24 hr tablet TAKE 0.5 TABLETS (12.5 MG TOTAL) BY MOUTH DAILY. 03/14/13  Yes Dianne Dun, MD  Multiple Vitamin (MULTIVITAMIN) tablet Take 1 tablet by mouth daily.   Yes Historical Provider, MD  naproxen sodium (ANAPROX) 220 MG tablet Take 2 by mouth twice a day 3 to 5 days a week   Yes Historical Provider, MD  OVER THE COUNTER MEDICATION Take  2 tablets by mouth at bedtime as needed (for pain/sleep. Takes Aleve PM).   Yes Historical Provider, MD  PARoxetine (PAXIL) 20 MG tablet Take 20 mg by mouth daily.   Yes Historical Provider, MD  Pseudoephedrine-APAP-DM (DAYQUIL PO) Take 2 capsules by mouth every 6 (six) hours as needed (for cold symptoms).   Yes Historical Provider, MD  traMADol (ULTRAM) 50 MG tablet Take 100 mg by mouth every 6 (six) hours as needed for moderate pain.   Yes Historical Provider, MD   Physical Exam: Filed Vitals:   06/12/13 1300  BP: 105/66  Pulse: 83  Temp:   Resp: 16     General:  Obese  Eyes:  EOMI, NCAT  ENT: mallampati stage 3  Neck: Thick, no thyromegally.  Cannot assess JVD  Cardiovascular: s1 s2 no m/r/g.  CP non-reproducible  Respiratory: clear  Abdomen: soft, Nt ND  Skin: no LE edema  Musculoskeletal:  ROm intact   Psychiatric: Euthymic  Neurologic:  Intact grossly  Labs on Admission:  Basic Metabolic Panel:  Recent Labs Lab 06/12/13 1112  NA 139  K 4.1  CL 103  CO2 25  GLUCOSE 97  BUN 13  CREATININE 0.80  CALCIUM 9.2   Liver Function Tests: No results found for this basename: AST, ALT, ALKPHOS, BILITOT, PROT, ALBUMIN,  in the last 168 hours No results found for this basename: LIPASE, AMYLASE,  in the last 168 hours No results found for this basename: AMMONIA,  in the last 168 hours CBC:  Recent Labs Lab 06/12/13 1112  WBC 9.9  HGB 13.0  HCT 40.7  MCV 79.6  PLT 263   Cardiac Enzymes: No results found for this basename: CKTOTAL, CKMB, CKMBINDEX, TROPONINI,  in the last 168 hours  BNP (last 3 results) No results found for this basename: PROBNP,  in the last 8760 hours CBG: No results found for this basename: GLUCAP,  in the last 168 hours  Radiological Exams on Admission: Dg Chest Port 1 View  06/12/2013   CLINICAL DATA:  Chest pain, tachycardia  EXAM: PORTABLE CHEST - 1 VIEW  COMPARISON:  08/01/2012  FINDINGS: The heart size and mediastinal contours  are within normal limits. Both lungs are clear. The visualized skeletal structures are unremarkable.  IMPRESSION: No active disease.   Electronically Signed   By: Ruel Favors M.D.   On: 06/12/2013 11:14     EKG: Independently reviewed. Normal sinus rhythm PR interval 0.12 QRS axis 35 no acute ST-T wave elevations in precordial leads  Assessment/Plan Principal Problem:   Chest pain-heart score 4-5.  No current EKG or troponin changes however history concerning enough that may need inpatient stress Myoview.  Unclear if would be cardiac cath candidate.  Appreciate cardiology assistance-if goes for  invasive workup, have requested them to kindly take over management-DDX = potential anxiety component given psychosocial stressors, restrictive lung disease, OHS given super morbid obesity.  At this stage i am not thinking this is a Pulmonary embolism given lack of SOB or dyspnea and will hold off on CTA chest at present, pending thought per Cardiologist.  Appreciate input in advance Active Problems:   Morbid obesity BMI~ 50-is a gastric outlet bypass candidate-obesity at this level is life-threatening   Tachycardia-unclear if ever worked up. Will obtain a TSH   HTN (hypertension)-moderately controlled on Coreg 12.5 twice a day, continue Lisinopril 5 mg daily   Leukocytosis, unspecified--resolved currently   Urethral stricture-has some hematuria-monitor.  OP work up per PCP   Former tobacco use-risk factor for further acute cardiovascular, cerebrovascular issues   Time spent: 63  Mahala Menghini [name] Pager [phone]  If 7PM-7AM, please contact night-coverage [email or web address] Password [email or web address] 06/12/2013, 2:04 PM

## 2013-06-12 NOTE — ED Notes (Addendum)
Onset of chest pain 2 days ago continued today. Denies shortness of breath. EMS gave one nitro and 324mg  aspirin.

## 2013-06-13 ENCOUNTER — Telehealth: Payer: Self-pay | Admitting: Family Medicine

## 2013-06-13 ENCOUNTER — Encounter (HOSPITAL_COMMUNITY): Payer: Self-pay | Admitting: General Practice

## 2013-06-13 ENCOUNTER — Encounter (HOSPITAL_COMMUNITY)
Admission: EM | Disposition: A | Discharge status: Home / Self Care | Payer: Self-pay | Source: Home / Self Care | Attending: Emergency Medicine

## 2013-06-13 DIAGNOSIS — R079 Chest pain, unspecified: Secondary | ICD-10-CM

## 2013-06-13 HISTORY — PX: LEFT HEART CATHETERIZATION WITH CORONARY ANGIOGRAM: SHX5451

## 2013-06-13 LAB — LIPID PANEL
Cholesterol: 200 mg/dL (ref 0–200)
HDL: 34 mg/dL — ABNORMAL LOW (ref >39)
Total CHOL/HDL Ratio: 5.9 RATIO
VLDL: 30 mg/dL (ref 0–40)

## 2013-06-13 LAB — URINALYSIS, ROUTINE W REFLEX MICROSCOPIC
Hgb urine dipstick: NEGATIVE
Ketones, ur: NEGATIVE mg/dL
Nitrite: NEGATIVE
Specific Gravity, Urine: 1.024 (ref 1.005–1.030)
Urobilinogen, UA: 1 mg/dL (ref 0.0–1.0)

## 2013-06-13 LAB — COMPREHENSIVE METABOLIC PANEL
ALT: 18 U/L (ref 0–53)
Albumin: 3.5 g/dL (ref 3.5–5.2)
Alkaline Phosphatase: 130 U/L — ABNORMAL HIGH (ref 39–117)
BUN: 14 mg/dL (ref 6–23)
Calcium: 9 mg/dL (ref 8.4–10.5)
Creatinine, Ser: 0.88 mg/dL (ref 0.50–1.35)
Glucose, Bld: 96 mg/dL (ref 70–99)
Potassium: 4.4 mEq/L (ref 3.5–5.1)
Sodium: 139 mEq/L (ref 135–145)
Total Protein: 6.9 g/dL (ref 6.0–8.3)

## 2013-06-13 LAB — URINE MICROSCOPIC-ADD ON

## 2013-06-13 LAB — CBC
HCT: 42.7 % (ref 39.0–52.0)
Hemoglobin: 13.7 g/dL (ref 13.0–17.0)
MCV: 80.1 fL (ref 78.0–100.0)
RDW: 15.4 % (ref 11.5–15.5)
WBC: 11.2 10*3/uL — ABNORMAL HIGH (ref 4.0–10.5)

## 2013-06-13 LAB — TROPONIN I: Troponin I: <0.3 ng/mL (ref <0.30)

## 2013-06-13 LAB — GLUCOSE, CAPILLARY: Glucose-Capillary: 92 mg/dL (ref 70–99)

## 2013-06-13 SURGERY — LEFT HEART CATHETERIZATION WITH CORONARY ANGIOGRAM
Anesthesia: LOCAL

## 2013-06-13 MED ORDER — INFLUENZA VAC SPLIT QUAD 0.5 ML IM SUSP: 0.5000 mL | INTRAMUSCULAR | Status: DC

## 2013-06-13 MED ORDER — MIDAZOLAM HCL 2 MG/2ML IJ SOLN
INTRAMUSCULAR | Status: AC
  Filled 2013-06-13: qty 2

## 2013-06-13 MED ORDER — SODIUM CHLORIDE 0.9 % IV SOLN: INTRAVENOUS | Status: DC

## 2013-06-13 MED ORDER — HEPARIN SODIUM (PORCINE) 1000 UNIT/ML IJ SOLN
INTRAMUSCULAR | Status: AC
  Filled 2013-06-13: qty 1

## 2013-06-13 MED ORDER — HEPARIN (PORCINE) IN NACL 2-0.9 UNIT/ML-% IJ SOLN
INTRAMUSCULAR | Status: AC
  Filled 2013-06-13: qty 1500

## 2013-06-13 MED ORDER — ONDANSETRON HCL 4 MG/2ML IJ SOLN: 4.0000 mg | Freq: Four times a day (QID) | INTRAMUSCULAR | Status: DC | PRN

## 2013-06-13 MED ORDER — LIDOCAINE HCL (PF) 1 % IJ SOLN
INTRAMUSCULAR | Status: AC
  Filled 2013-06-13: qty 30

## 2013-06-13 MED ORDER — ACETAMINOPHEN 325 MG PO TABS: 650.0000 mg | ORAL_TABLET | ORAL | Status: DC | PRN

## 2013-06-13 MED ORDER — METOPROLOL SUCCINATE 12.5 MG HALF TABLET
12.5000 mg | ORAL_TABLET | Freq: Every day | ORAL | Status: DC
  Administered 2013-06-13: 12.5 mg via ORAL
  Filled 2013-06-13: qty 1

## 2013-06-13 MED ORDER — ASPIRIN 81 MG PO CHEW: 81.0000 mg | CHEWABLE_TABLET | ORAL | Status: AC

## 2013-06-13 MED ORDER — VERAPAMIL HCL 2.5 MG/ML IV SOLN
INTRAVENOUS | Status: AC
  Filled 2013-06-13: qty 2

## 2013-06-13 NOTE — Progress Notes (Signed)
Echo results conveyed to Dr. Antoine Poche. Dobutamine echo arrangement was attempted but endoscopy was not comfortable pushing definity and dobutamine together. Pharmacy confirmed not compatible. Per d/w Dr. Antoine Poche, proceed with cardiac cath as discussed in original note. I spoke with [name] this AM. Risks and benefits of cardiac catheterization have been discussed with the patient. These include bleeding, infection, kidney damage, stroke, heart attack, death. The patient understands these risks and is willing to proceed. Jaelynne Hockley PA-C

## 2013-06-13 NOTE — Telephone Encounter (Signed)
Notify pt per Dr. Jenene Slicker note... Recommended follow up for hematuria with PCP.Richard Mcguire Have him make appt with anyone available to eval further if this is an acute issue.  If chronic 9 ongoing a  Long time) can wait for return of Dr. Dayton Martes.

## 2013-06-13 NOTE — Discharge Summary (Signed)
Discharge Summary   Patient ID: Richard Mcguire MRN: [id number], DOB/AGE: 41/16/1972 41 y.o. Admit date: 06/12/2013 D/C date:     06/13/2013   Primary Cardiologist: Dr. Antoine Poche   Principal Problem:   Chest pain- non-cardiac  Active Problems:   Morbid obesity- Body mass index is 47.29 kg/(m^2).   Tachycardia- evaluated by PCP 08/01/12   HTN (hypertension)   Urethral stricture   Former tobacco use- 30 years   Hematuria   Chronic fatigue   Hospital Course: Richard Mcguire is a 42 y/o M with no prior cardiac history but a history of HTN, 30 yrs former tobacco abuse, morbid obesity, chronic pain/fatigue, migraines who presented to [facility] today with 2-day history of CP. The chest pain had both typical and atypical features and was partially responsive to NTG. Patient has several risk factors for CAD including family history of MI in maternal grandmother, morbid obesity, long term tobacco abuse and hypertension. Ideally, a noninvasive evaluation would have been preferred; however, his morbid obesity limited the options. He is unable to exercise on a treadmill secondary to orthopedic issues. A dobutamine stress ECHO was unable to be performed due to severely limited acoustic windows. Difinity was deemed incompatible with dobutamine and there would have been no other way to get good pictures. He ruled out for a MI. In the setting of NTG responsive chest pain and cardiac risk factors a cath was performed for definitive evaluation. This showed no CAD and chest pain was felt to be non-cardiac in nature. He was instructed to contact his PCP to discuss further evaluation of non cardiac chest pain.  Dr. Antoine Poche has seen and examined him and deemed him ready for discharge.   He was also instructed to follow up with his PCP to discuss self reported intermittent hematuria.   Discharge Vitals: Blood pressure 141/81, pulse 75, temperature 97.9 F (36.6 C), resp. rate 18, height 6\' 2"  (1.88  m), weight 368 lb 8 oz (167.151 kg), SpO2 95.00%.  Labs: Lab Results  Component Value Date   WBC 11.2* 06/13/2013   HGB 13.7 06/13/2013   HCT 42.7 06/13/2013   MCV 80.1 06/13/2013   PLT 259 06/13/2013     Recent Labs Lab 06/13/13 0340  NA 139  K 4.4  CL 103  CO2 27  BUN 14  CREATININE 0.88  CALCIUM 9.0  PROT 6.9  BILITOT 0.5  ALKPHOS 130*  ALT 18  AST 17  GLUCOSE 96    Recent Labs  06/12/13 1420 06/12/13 1546 06/12/13 2230 06/13/13 0340  TROPONINI <0.30 <0.30 <0.30 <0.30   Lab Results  Component Value Date   CHOL 200 06/13/2013   HDL 34* 06/13/2013   LDLCALC 136* 06/13/2013   TRIG 152* 06/13/2013   Lab Results  Component Value Date   DDIMER 1.29* 07/29/2012    Diagnostic Studies/Procedures   Cardiac Cath on 06/13/13 Coronary angiography:  Coronary dominance: Right  Left mainstem: Normal  Left anterior descending (LAD): Large vessel wrapping the apex. Normal. D1 small and normal.  Left circumflex (LCx): Large AV groove with mild luminal irregularities. MOM large and normal. PL large and normal.  Right coronary artery (RCA): Large dominant normal. PDA small and normal  Left ventriculography: Left ventricular systolic function is normal, LVEF is estimated at 65, there is no significant mitral regurgitation  Final Conclusions: Minimal coronary plaque. NL LV function.  Recommendations: No further cardiac work up. Follow up with primary MD to evaluate nonanginal pain.  Dg Chest Port 1 View  06/12/2013   CLINICAL DATA:  Chest pain, tachycardia  EXAM: PORTABLE CHEST - 1 VIEW  COMPARISON:  08/01/2012  FINDINGS: The heart size and mediastinal contours are within normal limits. Both lungs are clear. The visualized skeletal structures are unremarkable.  IMPRESSION: No active disease.   Electronically Signed   By: Ruel Favors M.D.   On: 06/12/2013 11:14    Discharge Medications     Medication List         cyclobenzaprine 10 MG tablet  Commonly known as:   FLEXERIL  Take 10 mg by mouth 2 (two) times daily as needed for muscle spasms.     DAYQUIL PO  Take 2 capsules by mouth every 6 (six) hours as needed (for cold symptoms).     lisinopril 20 MG tablet  Commonly known as:  PRINIVIL,ZESTRIL  Take 0.5 tablets (10 mg total) by mouth daily.     metoprolol succinate 25 MG 24 hr tablet  Commonly known as:  TOPROL-XL  TAKE 0.5 TABLETS (12.5 MG TOTAL) BY MOUTH DAILY.     multivitamin tablet  Take 1 tablet by mouth daily.     naproxen sodium 220 MG tablet  Commonly known as:  ANAPROX  Take 2 by mouth twice a day 3 to 5 days a week     OVER THE COUNTER MEDICATION  Take 2 tablets by mouth at bedtime as needed (for pain/sleep. Takes Aleve PM).     PARoxetine 20 MG tablet  Commonly known as:  PAXIL  Take 20 mg by mouth daily.     traMADol 50 MG tablet  Commonly known as:  ULTRAM  Take 100 mg by mouth every 6 (six) hours as needed for moderate pain.        Disposition   The patient will be discharged in stable condition to home. Discharge Orders   Future Orders Complete By Expires   Diet - low sodium heart healthy  As directed    Increase activity slowly  As directed    Scheduling Instructions:     No driving for 2 days. No lifting over 5 lbs for 1 week. No sexual activity for 1 week. Keep procedure site clean & dry. If you notice increased pain, swelling, bleeding or pus, call/return!  You may shower, but no soaking baths/hot tubs/pools for 1 week.         Duration of Discharge Encounter: Greater than 30 minutes including physician and PA time.  Urban Gibson PA-C 06/13/2013, 3:13 PM  Patient seen and examined.  Plan as discussed in my rounding note for today and outlined above. [name]  06/13/2013  3:23 PM

## 2013-06-13 NOTE — Progress Notes (Signed)
 SUBJECTIVE:  Still with some chest tightness overnight.     PHYSICAL EXAM Filed Vitals:   06/12/13 1512 06/12/13 2100 06/13/13 0500 06/13/13 0825  BP: 119/81 133/83 141/81   Pulse: 78 84 84   Temp:  98.1 F (36.7 C) 97.9 F (36.6 C)   Resp: 18 18 18   Height:    6' 2" (1.88 m)  Weight:    368 lb 8 oz (167.151 kg)  SpO2: 99% 96% 95%    General:  No distress Lungs:  Clear Heart:  RRR Abdomen:  Positive bowel sounds, no rebound no guarding Extremities:  No edema  LABS: Lab Results  Component Value Date   TROPONINI <0.30 06/13/2013   Results for orders placed during the hospital encounter of 06/12/13 (from the past 24 hour(s))  CBC     Status: Abnormal   Collection Time    06/12/13 11:12 AM      Result Value Range   WBC 9.9  4.0 - 10.5 K/uL   RBC 5.11  4.22 - 5.81 MIL/uL   Hemoglobin 13.0  13.0 - 17.0 g/dL   HCT 40.7  39.0 - 52.0 %   MCV 79.6  78.0 - 100.0 fL   MCH 25.4 (*) 26.0 - 34.0 pg   MCHC 31.9  30.0 - 36.0 g/dL   RDW 15.0  11.5 - 15.5 %   Platelets 263  150 - 400 K/uL  BASIC METABOLIC PANEL     Status: None   Collection Time    06/12/13 11:12 AM      Result Value Range   Sodium 139  135 - 145 mEq/L   Potassium 4.1  3.5 - 5.1 mEq/L   Chloride 103  96 - 112 mEq/L   CO2 25  19 - 32 mEq/L   Glucose, Bld 97  70 - 99 mg/dL   BUN 13  6 - 23 mg/dL   Creatinine, Ser 0.80  0.50 - 1.35 mg/dL   Calcium 9.2  8.4 - 10.5 mg/dL   GFR calc non Af Amer >90  >90 mL/min   GFR calc Af Amer >90  >90 mL/min  POCT I-STAT TROPONIN I     Status: None   Collection Time    06/12/13 12:24 PM      Result Value Range   Troponin i, poc 0.00  0.00 - 0.08 ng/mL   Comment 3           TROPONIN I     Status: None   Collection Time    06/12/13  2:20 PM      Result Value Range   Troponin I <0.30  <0.30 ng/mL  TSH     Status: None   Collection Time    06/12/13  2:21 PM      Result Value Range   TSH 0.461  0.350 - 4.500 uIU/mL  TROPONIN I     Status: None   Collection Time   06/12/13  3:46 PM      Result Value Range   Troponin I <0.30  <0.30 ng/mL  HEPATIC FUNCTION PANEL     Status: Abnormal   Collection Time    06/12/13  4:30 PM      Result Value Range   Total Protein 6.6  6.0 - 8.3 g/dL   Albumin 3.1 (*) 3.5 - 5.2 g/dL   AST 30  0 - 37 U/L   ALT 18  0 - 53 U/L   Alkaline Phosphatase 117    39 - 117 U/L   Total Bilirubin 0.4  0.3 - 1.2 mg/dL   Bilirubin, Direct 0.1  0.0 - 0.3 mg/dL   Indirect Bilirubin 0.3  0.3 - 0.9 mg/dL  LIPASE, BLOOD     Status: None   Collection Time    06/12/13  4:30 PM      Result Value Range   Lipase 23  11 - 59 U/L  GLUCOSE, CAPILLARY     Status: None   Collection Time    06/12/13  8:43 PM      Result Value Range   Glucose-Capillary 93  70 - 99 mg/dL  TROPONIN I     Status: None   Collection Time    06/12/13 10:30 PM      Result Value Range   Troponin I <0.30  <0.30 ng/mL  TROPONIN I     Status: None   Collection Time    06/13/13  3:40 AM      Result Value Range   Troponin I <0.30  <0.30 ng/mL  COMPREHENSIVE METABOLIC PANEL     Status: Abnormal   Collection Time    06/13/13  3:40 AM      Result Value Range   Sodium 139  135 - 145 mEq/L   Potassium 4.4  3.5 - 5.1 mEq/L   Chloride 103  96 - 112 mEq/L   CO2 27  19 - 32 mEq/L   Glucose, Bld 96  70 - 99 mg/dL   BUN 14  6 - 23 mg/dL   Creatinine, Ser 0.88  0.50 - 1.35 mg/dL   Calcium 9.0  8.4 - 10.5 mg/dL   Total Protein 6.9  6.0 - 8.3 g/dL   Albumin 3.5  3.5 - 5.2 g/dL   AST 17  0 - 37 U/L   ALT 18  0 - 53 U/L   Alkaline Phosphatase 130 (*) 39 - 117 U/L   Total Bilirubin 0.5  0.3 - 1.2 mg/dL   GFR calc non Af Amer >90  >90 mL/min   GFR calc Af Amer >90  >90 mL/min  CBC     Status: Abnormal   Collection Time    06/13/13  3:40 AM      Result Value Range   WBC 11.2 (*) 4.0 - 10.5 K/uL   RBC 5.33  4.22 - 5.81 MIL/uL   Hemoglobin 13.7  13.0 - 17.0 g/dL   HCT 42.7  39.0 - 52.0 %   MCV 80.1  78.0 - 100.0 fL   MCH 25.7 (*) 26.0 - 34.0 pg   MCHC 32.1  30.0 -  36.0 g/dL   RDW 15.4  11.5 - 15.5 %   Platelets 259  150 - 400 K/uL  PROTIME-INR     Status: None   Collection Time    06/13/13  3:40 AM      Result Value Range   Prothrombin Time 12.5  11.6 - 15.2 seconds   INR 0.95  0.00 - 1.49  LIPID PANEL     Status: Abnormal   Collection Time    06/13/13  3:40 AM      Result Value Range   Cholesterol 200  0 - 200 mg/dL   Triglycerides 152 (*) <150 mg/dL   HDL 34 (*) >39 mg/dL   Total CHOL/HDL Ratio 5.9     VLDL 30  0 - 40 mg/dL   LDL Cholesterol 136 (*) 0 - 99 mg/dL  GLUCOSE, CAPILLARY     Status:   None   Collection Time    06/13/13  7:36 AM      Result Value Range   Glucose-Capillary 92  70 - 99 mg/dL   Comment 1 Documented in Chart     No intake or output data in the 24 hours ending 06/13/13 0921  EKG:    ASSESSMENT AND PLAN:  CHEST PAIN:  Continued pain.  Unable to get dobutamine echo or CTA.  Given this and moderately high pretest probability of CAD the plan is for cath today.  Discussed with the patient.  The patient understands that risks included but are not limited to stroke (1 in 1000), death (1 in 1000), kidney failure [usually temporary] (1 in 500), bleeding (1 in 200), allergic reaction [possibly serious] (1 in 200).  The patient understands and agrees to proceed.   HTN:  BP OK.  Continue current therapy.   HEMATURIA:  UA has not been collected yet as far as I can tell.   DYSLIPIDEMIA:   Plan for treatment pending results of the cath.   Telicia Hodgkiss 06/13/2013 9:21 AM   

## 2013-06-13 NOTE — CV Procedure (Signed)
  Cardiac Catheterization Procedure Note  Name: Richard Mcguire MRN: [id number] DOB: August 25, 1970  Procedure: Left Heart Cath, Selective Coronary Angiography, LV angiography  Indication:    Procedural details: The right radial was prepped, draped, and anesthetized with 1% lidocaine. Using modified Seldinger technique, a 5 French sheath was introduced into the right radial artery. Standard Judkins catheters were used for coronary angiography and left ventriculography. Catheter exchanges were performed over a guidewire. There were no immediate procedural complications. The patient was transferred to the post catheterization recovery area for further monitoring.  Procedural Findings:   Hemodynamics:     AO 107/77    LV 112/6   Coronary angiography:   Coronary dominance: Right  Left mainstem:   Normal  Left anterior descending (LAD):   Large vessel wrapping the apex.  Normal.  D1 small and normal.   Left circumflex (LCx):  Large AV groove with mild luminal irregularities.  MOM large and normal.  PL large and normal.    Right coronary artery (RCA):  Large dominant normal.  PDA small and normal  Left ventriculography: Left ventricular systolic function is normal, LVEF is estimated at 65, there is no significant mitral regurgitation   Final Conclusions:  Minimal coronary plaque.  NL LV function.   Recommendations:   No further cardiac work up.  Follow up with primary MD to evaluate nonanginal pain.    [name] 06/13/2013, 1:09 PM

## 2013-06-13 NOTE — H&P (View-Only) (Signed)
SUBJECTIVE:  Still with some chest tightness overnight.     PHYSICAL EXAM Filed Vitals:   06/12/13 1512 06/12/13 2100 06/13/13 0500 06/13/13 0825  BP: 119/81 133/83 141/81   Pulse: 78 84 84   Temp:  98.1 F (36.7 C) 97.9 F (36.6 C)   Resp: 18 18 18    Height:    6\' 2"  (1.88 m)  Weight:    368 lb 8 oz (167.151 kg)  SpO2: 99% 96% 95%    General:  No distress Lungs:  Clear Heart:  RRR Abdomen:  Positive bowel sounds, no rebound no guarding Extremities:  No edema  LABS: Lab Results  Component Value Date   TROPONINI <0.30 06/13/2013   Results for orders placed during the hospital encounter of 06/12/13 (from the past 24 hour(s))  CBC     Status: Abnormal   Collection Time    06/12/13 11:12 AM      Result Value Range   WBC 9.9  4.0 - 10.5 K/uL   RBC 5.11  4.22 - 5.81 MIL/uL   Hemoglobin 13.0  13.0 - 17.0 g/dL   HCT 78.2  95.6 - 21.3 %   MCV 79.6  78.0 - 100.0 fL   MCH 25.4 (*) 26.0 - 34.0 pg   MCHC 31.9  30.0 - 36.0 g/dL   RDW 08.6  57.8 - 46.9 %   Platelets 263  150 - 400 K/uL  BASIC METABOLIC PANEL     Status: None   Collection Time    06/12/13 11:12 AM      Result Value Range   Sodium 139  135 - 145 mEq/L   Potassium 4.1  3.5 - 5.1 mEq/L   Chloride 103  96 - 112 mEq/L   CO2 25  19 - 32 mEq/L   Glucose, Bld 97  70 - 99 mg/dL   BUN 13  6 - 23 mg/dL   Creatinine, Ser 6.29  0.50 - 1.35 mg/dL   Calcium 9.2  8.4 - 52.8 mg/dL   GFR calc non Af Amer >90  >90 mL/min   GFR calc Af Amer >90  >90 mL/min  POCT I-STAT TROPONIN I     Status: None   Collection Time    06/12/13 12:24 PM      Result Value Range   Troponin i, poc 0.00  0.00 - 0.08 ng/mL   Comment 3           TROPONIN I     Status: None   Collection Time    06/12/13  2:20 PM      Result Value Range   Troponin I <0.30  <0.30 ng/mL  TSH     Status: None   Collection Time    06/12/13  2:21 PM      Result Value Range   TSH 0.461  0.350 - 4.500 uIU/mL  TROPONIN I     Status: None   Collection Time   06/12/13  3:46 PM      Result Value Range   Troponin I <0.30  <0.30 ng/mL  HEPATIC FUNCTION PANEL     Status: Abnormal   Collection Time    06/12/13  4:30 PM      Result Value Range   Total Protein 6.6  6.0 - 8.3 g/dL   Albumin 3.1 (*) 3.5 - 5.2 g/dL   AST 30  0 - 37 U/L   ALT 18  0 - 53 U/L   Alkaline Phosphatase 117  39 - 117 U/L   Total Bilirubin 0.4  0.3 - 1.2 mg/dL   Bilirubin, Direct 0.1  0.0 - 0.3 mg/dL   Indirect Bilirubin 0.3  0.3 - 0.9 mg/dL  LIPASE, BLOOD     Status: None   Collection Time    06/12/13  4:30 PM      Result Value Range   Lipase 23  11 - 59 U/L  GLUCOSE, CAPILLARY     Status: None   Collection Time    06/12/13  8:43 PM      Result Value Range   Glucose-Capillary 93  70 - 99 mg/dL  TROPONIN I     Status: None   Collection Time    06/12/13 10:30 PM      Result Value Range   Troponin I <0.30  <0.30 ng/mL  TROPONIN I     Status: None   Collection Time    06/13/13  3:40 AM      Result Value Range   Troponin I <0.30  <0.30 ng/mL  COMPREHENSIVE METABOLIC PANEL     Status: Abnormal   Collection Time    06/13/13  3:40 AM      Result Value Range   Sodium 139  135 - 145 mEq/L   Potassium 4.4  3.5 - 5.1 mEq/L   Chloride 103  96 - 112 mEq/L   CO2 27  19 - 32 mEq/L   Glucose, Bld 96  70 - 99 mg/dL   BUN 14  6 - 23 mg/dL   Creatinine, Ser 1.61  0.50 - 1.35 mg/dL   Calcium 9.0  8.4 - 09.6 mg/dL   Total Protein 6.9  6.0 - 8.3 g/dL   Albumin 3.5  3.5 - 5.2 g/dL   AST 17  0 - 37 U/L   ALT 18  0 - 53 U/L   Alkaline Phosphatase 130 (*) 39 - 117 U/L   Total Bilirubin 0.5  0.3 - 1.2 mg/dL   GFR calc non Af Amer >90  >90 mL/min   GFR calc Af Amer >90  >90 mL/min  CBC     Status: Abnormal   Collection Time    06/13/13  3:40 AM      Result Value Range   WBC 11.2 (*) 4.0 - 10.5 K/uL   RBC 5.33  4.22 - 5.81 MIL/uL   Hemoglobin 13.7  13.0 - 17.0 g/dL   HCT 04.5  40.9 - 81.1 %   MCV 80.1  78.0 - 100.0 fL   MCH 25.7 (*) 26.0 - 34.0 pg   MCHC 32.1  30.0 -  36.0 g/dL   RDW 91.4  78.2 - 95.6 %   Platelets 259  150 - 400 K/uL  PROTIME-INR     Status: None   Collection Time    06/13/13  3:40 AM      Result Value Range   Prothrombin Time 12.5  11.6 - 15.2 seconds   INR 0.95  0.00 - 1.49  LIPID PANEL     Status: Abnormal   Collection Time    06/13/13  3:40 AM      Result Value Range   Cholesterol 200  0 - 200 mg/dL   Triglycerides 213 (*) <150 mg/dL   HDL 34 (*) >08 mg/dL   Total CHOL/HDL Ratio 5.9     VLDL 30  0 - 40 mg/dL   LDL Cholesterol 657 (*) 0 - 99 mg/dL  GLUCOSE, CAPILLARY     Status:  None   Collection Time    06/13/13  7:36 AM      Result Value Range   Glucose-Capillary 92  70 - 99 mg/dL   Comment 1 Documented in Chart     No intake or output data in the 24 hours ending 06/13/13 0921  EKG:    ASSESSMENT AND PLAN:  CHEST PAIN:  Continued pain.  Unable to get dobutamine echo or CTA.  Given this and moderately high pretest probability of CAD the plan is for cath today.  Discussed with the patient.  The patient understands that risks included but are not limited to stroke (1 in 1000), death (1 in 1000), kidney failure [usually temporary] (1 in 500), bleeding (1 in 200), allergic reaction [possibly serious] (1 in 200).  The patient understands and agrees to proceed.   HTN:  BP OK.  Continue current therapy.   HEMATURIA:  UA has not been collected yet as far as I can tell.   DYSLIPIDEMIA:   Plan for treatment pending results of the cath.   Fayrene Fearing [facility] 06/13/2013 9:21 AM

## 2013-06-13 NOTE — Interval H&P Note (Signed)
History and Physical Interval Note:  06/13/2013 12:32 PM  Richard Mcguire  has presented today for surgery, with the diagnosis of cp  The various methods of treatment have been discussed with the patient and family. After consideration of risks, benefits and other options for treatment, the patient has consented to  Procedure(s): LEFT HEART CATHETERIZATION WITH CORONARY ANGIOGRAM (N/A) as a surgical intervention .  The patient's history has been reviewed, patient examined, no change in status, stable for surgery.  I have reviewed the patient's chart and labs.  Questions were answered to the patient's satisfaction.     [name]

## 2013-06-14 NOTE — ED Provider Notes (Signed)
Medical screening examination/treatment/procedure(s) were conducted as a shared visit with non-physician practitioner(s) and myself.  I personally evaluated the patient during the encounter.  EKG Interpretation    Date/Time:  Monday June 12 2013 10:40:47 EST Ventricular Rate:  90 PR Interval:  155 QRS Duration: 81 QT Interval:  338 QTC Calculation: 413 R Axis:   52 Text Interpretation:  Sinus rhythm Normal ECG Confirmed by Bebe Shaggy  MD, Sailor Hevia ([id number]) on 06/12/2013 10:51:05 AM             Pt well appearing, feeling improved on my evaluation, admission advised and pt agreed  Joya Gaskins, MD 06/14/13 [phone]

## 2013-06-14 NOTE — Telephone Encounter (Signed)
Patient is scheduled for 06/16/2013 at 9:15 am with Dr. Sharen Hones.

## 2013-06-14 NOTE — Telephone Encounter (Signed)
Appt scheduled for hematuria.

## 2013-06-14 NOTE — Telephone Encounter (Signed)
Message left for patient to return my call. Noted patient had cardiac cath and also has hematuria. Advised he needed follow up and eval for hematuria. Will await return call.

## 2013-06-16 ENCOUNTER — Ambulatory Visit (INDEPENDENT_AMBULATORY_CARE_PROVIDER_SITE_OTHER): Payer: Medicare Other | Admitting: Family Medicine

## 2013-06-16 ENCOUNTER — Encounter: Payer: Self-pay | Admitting: Family Medicine

## 2013-06-16 VITALS — BP 126/84 | HR 99 | Temp 99.2°F | Wt 371.0 lb

## 2013-06-16 DIAGNOSIS — R0989 Other specified symptoms and signs involving the circulatory and respiratory systems: Secondary | ICD-10-CM

## 2013-06-16 DIAGNOSIS — N481 Balanitis: Secondary | ICD-10-CM | POA: Insufficient documentation

## 2013-06-16 DIAGNOSIS — R319 Hematuria, unspecified: Secondary | ICD-10-CM

## 2013-06-16 DIAGNOSIS — G4733 Obstructive sleep apnea (adult) (pediatric): Secondary | ICD-10-CM | POA: Insufficient documentation

## 2013-06-16 DIAGNOSIS — R0609 Other forms of dyspnea: Secondary | ICD-10-CM

## 2013-06-16 DIAGNOSIS — Z23 Encounter for immunization: Secondary | ICD-10-CM

## 2013-06-16 DIAGNOSIS — N35919 Unspecified urethral stricture, male, unspecified site: Secondary | ICD-10-CM

## 2013-06-16 DIAGNOSIS — R079 Chest pain, unspecified: Secondary | ICD-10-CM

## 2013-06-16 DIAGNOSIS — R0683 Snoring: Secondary | ICD-10-CM

## 2013-06-16 DIAGNOSIS — N476 Balanoposthitis: Secondary | ICD-10-CM

## 2013-06-16 LAB — POCT URINALYSIS DIPSTICK
Bilirubin, UA: NEGATIVE
Glucose, UA: NEGATIVE
Ketones, UA: NEGATIVE
Leukocytes, UA: NEGATIVE
Nitrite, UA: NEGATIVE

## 2013-06-16 MED ORDER — CLOTRIMAZOLE 1 % EX CREA: 1.0000 "application " | TOPICAL_CREAM | Freq: Two times a day (BID) | CUTANEOUS | Status: DC

## 2013-06-16 MED ORDER — TRAMADOL HCL 50 MG PO TABS: 100.0000 mg | ORAL_TABLET | Freq: Four times a day (QID) | ORAL | Status: DC | PRN

## 2013-06-16 NOTE — Patient Instructions (Signed)
Flu shot today Tdap today (tetanus and pertussis). I do think there is a skin infection - treat with lotrimin over the counter, keep area around foreskin clean and dry, make sure to wash off all soap when showering.  Bleeding could have come from this. If persistent bleeding despite treatment, let us know for referral to urologist. Tramadol refilled today. Sleep questionairre today.

## 2013-06-16 NOTE — Progress Notes (Signed)
Pre-visit discussion using our clinic review tool. No additional management support is needed unless otherwise documented below in the visit note.  

## 2013-06-16 NOTE — Assessment & Plan Note (Signed)
Deemed noncardiac with recent normal cardiac catheterization.  Pt stable from this standpoint.

## 2013-06-16 NOTE — Assessment & Plan Note (Signed)
H/o this - has not seen urology recently.  Discussed if bleed ongoing despite balanitis treatment, will recommend referral to urologist.  Pt agrees with plan.

## 2013-06-16 NOTE — Assessment & Plan Note (Signed)
Treat with clotrimazole cream - if persistent blood noted on toilet paper, did recommend he contact us for referral to urology.  Pt agrees with plan.

## 2013-06-16 NOTE — Progress Notes (Signed)
   Subjective:    Patient ID: Richard Mcguire, male    DOB: 06-17-1971, 42 y.o.   MRN: [id number]  HPI CC: hematuria  Pt of Dr. Elmer Sow presents today with concerns for intermittent episodes of hematuria. H/o urethral stricture (saw [facility]), h/o kidney stones (remotely). Stricture may have started after traumatic attempts to catheterize him for prior back surgery. States h/o intermittent gross hematuria over the last year - specs of blood noted more when he strains to void.  Intermittent itching going on as well for the last year.  Recent hospitalization for noncardiac chest pain - eval included cardiac catheterization which showed minimal plaque with normal LV function. Body habitus precluded diagnostic pharmacologic stress test.  Cardiologist in hospital recommended sleep study.  + snoring.  Denies apneic episodes.  Denies daytime somnolence - on disability for lower back pain.  No sleepiness driving or reading on sofa. Body mass index is 47.61 kg/(m^2).  Would like flu and tdap today.  Past Medical History  Diagnosis Date  . Major depressive disorder, recurrent episode, moderate   . Morbid obesity   . Chronic pain   . Anxiety   . Lumbar disc disease with radiculopathy     sees Dr. Jule Ser  . HTN (hypertension)   . Tachycardia     a. 07/2012 - placed on BB. CT angio neg.  . Migraine headache   . Chronic fatigue   . Kidney stone   . Urethral stricture     Was told he would need pediatric cath if ever had urinary cath  . Dyslipidemia      Review of Systems Per HPI    Objective:   Physical Exam  Nursing note and vitals reviewed. Constitutional: He appears well-developed and well-nourished. No distress.  HENT:  Mouth/Throat: Oropharynx is clear and moist. No oropharyngeal exudate.  Eyes: Conjunctivae and EOM are normal. Pupils are equal, round, and reactive to light. No scleral icterus.  Cardiovascular: Normal rate, regular rhythm, normal heart sounds and  intact distal pulses.   No murmur heard. Pulmonary/Chest: Effort normal and breath sounds normal. No respiratory distress. He has no wheezes. He has no rales.  dostant breath sounds  Genitourinary: Circumcised. Penile erythema present. No phimosis or hypospadias.  Slight erythema around base of head of penis Narrow urethral opening  Musculoskeletal: He exhibits no edema.       Assessment & Plan:

## 2013-06-16 NOTE — Assessment & Plan Note (Signed)
Actually UA/micro during hospitalization and UA today negative for blood.  See below regarding balanitis plan.

## 2013-06-16 NOTE — Assessment & Plan Note (Addendum)
ESS = 10 Discussed sleep apnea eval and will refer today.

## 2013-06-26 ENCOUNTER — Telehealth: Payer: Self-pay | Admitting: Cardiology

## 2013-06-26 NOTE — Telephone Encounter (Signed)
New Message  Pt wife calling for FMLA Forms. Wife states that she turned them in on 12/5 and has yet to receive confirmation that the forms are complete.. Please call back to discuss.

## 2013-06-26 NOTE — Telephone Encounter (Signed)
Pt is post cardiac cath on 06/13/13. With normal cardiac cath . Dr Antoine Poche performed the cath. Pt was F/u by his PCP post cath. Pt called today to F/U on FMLA form, turn in this office on 06/16/13. Pt is calling to confirm if it has been  completed. Left pt a message to call back.

## 2013-06-27 ENCOUNTER — Telehealth: Payer: Self-pay | Admitting: Cardiology

## 2013-06-27 NOTE — Telephone Encounter (Signed)
Richard Mcguire in [place] has paperwork and will contact the wife about picking them up.  I left a message for Richard Mcguire that wife is calling about FMLA forms

## 2013-06-27 NOTE — Telephone Encounter (Signed)
FMLA Completed by Dr.Hochrein called Phone Rings and Goes Blank, Will Mail  FMLA to PT

## 2013-06-27 NOTE — Telephone Encounter (Signed)
New message  Pt called about FMLA forms/// was advised to call back and speak to triage nurse// Please call back.

## 2013-06-28 ENCOUNTER — Telehealth: Payer: Self-pay | Admitting: Cardiology

## 2013-06-28 NOTE — Telephone Encounter (Signed)
Pt called states that he has not received a call about FMLA forms.. I informed pt that FMLA forms were mailed.. Pt requests a call back to confirm that the Forms were faxed to their proper destination before they were mailed out to the patients home// Please call back.

## 2013-07-27 ENCOUNTER — Ambulatory Visit (INDEPENDENT_AMBULATORY_CARE_PROVIDER_SITE_OTHER): Payer: Medicare Other | Admitting: Pulmonary Disease

## 2013-07-27 ENCOUNTER — Encounter: Payer: Self-pay | Admitting: Pulmonary Disease

## 2013-07-27 VITALS — BP 128/80 | HR 86 | Temp 99.0°F | Ht 73.0 in | Wt 380.0 lb

## 2013-07-27 DIAGNOSIS — G4733 Obstructive sleep apnea (adult) (pediatric): Secondary | ICD-10-CM

## 2013-07-27 NOTE — Progress Notes (Signed)
Subjective:    Patient ID: Richard Mcguire, male    DOB: 1971/04/01, 43 y.o.   MRN: [id number]  HPI The patient is a 43 year old male who I've been asked to see for possible obstructive sleep apnea. He is morbidly obese, and has been noted to have loud snoring during the night. His wife has not commented directly on witnessed apneas, but the patient has frequent awakenings at night. Complicating all of this, he also has a lot of back pain issues which can also contribute to this. The patient does not feel rested in the mornings upon arising, and notes definite sleep pressure during the day with inactivity. He feels that he is foggy on a daily basis. He will often take an afternoon nap, and then denies having any issues watching television or movies in the evening. He has no sleepiness with driving. The patient's Epworth score today is 9, and his weight is up about 10 pounds over the last 2 years.   Sleep Questionnaire What time do you typically go to bed?( Between what hours) 9-10p 9-10p at 1109 on 07/27/13 by Mathis Bud, RN How long does it take you to fall asleep? 2hours 2hours at 1109 on 07/27/13 by Mathis Bud, RN How many times during the night do you wake up? 14 3-5x at 1109 on 07/27/13 by Mathis Bud, RN What time do you get out of bed to start your day? No Value 7-8am at 1109 on 07/27/13 by Mathis Bud, RN Do you drive or operate heavy machinery in your occupation? No No at 1109 on 07/27/13 by Mathis Bud, RN How much has your weight changed (up or down) over the past two years? (In pounds) 10 lb (4.536 kg) 10 lb (4.536 kg) at 1109 on 07/27/13 by Mathis Bud, RN Have you ever had a sleep study before? No No at 1109 on 07/27/13 by Mathis Bud, RN Do you currently use CPAP? No No at 1109 on 07/27/13 by Mathis Bud, RN Do you wear oxygen at any time? No No at 1109 on 07/27/13 by Mathis Bud, RN   Review of Systems   Constitutional: Negative for fever and unexpected weight change.  HENT: Positive for congestion and sinus pressure. Negative for dental problem, ear pain, nosebleeds, postnasal drip, rhinorrhea, sneezing, sore throat and trouble swallowing.   Eyes: Negative for redness and itching.  Respiratory: Positive for shortness of breath. Negative for cough, chest tightness and wheezing.   Cardiovascular: Negative for palpitations and leg swelling.  Gastrointestinal: Negative for nausea and vomiting.  Genitourinary: Positive for dysuria.  Musculoskeletal: Negative for joint swelling.  Skin: Negative for rash.  Neurological: Positive for headaches.  Hematological: Does not bruise/bleed easily.  Psychiatric/Behavioral: Positive for dysphoric mood. The patient is nervous/anxious.        Objective:   Physical Exam Constitutional:  Morbidly obese male, no acute distress  HENT:  Nares patent without discharge, but enlarged turbinates.  Oropharynx without exudate, palate and uvula are thick and elongated.   Eyes:  Perrla, eomi, no scleral icterus  Neck:  No JVD, no TMG  Cardiovascular:  Normal rate, regular rhythm, no rubs or gallops.  No murmurs        Intact distal pulses  Pulmonary :  Normal breath sounds, no stridor or respiratory distress   No rales, rhonchi, or wheezing  Abdominal:  Soft, nondistended, bowel sounds present.  No tenderness noted.   Musculoskeletal:  Minimal lower extremity  edema noted.  Lymph Nodes:  No cervical lymphadenopathy noted  Skin:  No cyanosis noted  Neurologic:  Alert, appropriate, moves all 4 extremities without obvious deficit.         Assessment & Plan:

## 2013-07-27 NOTE — Patient Instructions (Signed)
Will schedule for home sleep testing, and arrange followup once the results are available. 

## 2013-07-27 NOTE — Assessment & Plan Note (Signed)
The patient's history is very suspicious for clinically significant sleep apnea. I have had a long discussion with him about sleep apnea, including its impact to his quality of life and cardiovascular health. He will need to have a sleep study for diagnosis, and he is an excellent candidate for home sleep testing. The patient is agreeable to this approach.

## 2013-08-10 ENCOUNTER — Other Ambulatory Visit: Payer: Self-pay | Admitting: Family Medicine

## 2013-08-10 NOTE — Telephone Encounter (Signed)
Pt requesting medication refill. I show meds to be on Hx list only. Last ov 07/29/13 with no future appts scheduled. pls advise

## 2013-08-22 DIAGNOSIS — G4733 Obstructive sleep apnea (adult) (pediatric): Secondary | ICD-10-CM

## 2013-08-24 ENCOUNTER — Telehealth: Payer: Self-pay | Admitting: Pulmonary Disease

## 2013-08-24 DIAGNOSIS — G4733 Obstructive sleep apnea (adult) (pediatric): Secondary | ICD-10-CM

## 2013-08-24 NOTE — Telephone Encounter (Signed)
Pt needs ov to review sleep study 

## 2013-08-24 NOTE — Telephone Encounter (Signed)
Scheduled for 09/04/13 at 46 with [place] to review sleep study

## 2013-08-25 ENCOUNTER — Encounter: Payer: Self-pay | Admitting: Pulmonary Disease

## 2013-09-04 ENCOUNTER — Ambulatory Visit: Payer: Medicare Other | Admitting: Pulmonary Disease

## 2013-09-10 ENCOUNTER — Other Ambulatory Visit: Payer: Self-pay | Admitting: Family Medicine

## 2013-09-22 ENCOUNTER — Ambulatory Visit: Payer: Medicare Other | Admitting: Pulmonary Disease

## 2013-10-25 ENCOUNTER — Other Ambulatory Visit: Payer: Self-pay | Admitting: Family Medicine

## 2013-12-12 ENCOUNTER — Other Ambulatory Visit: Payer: Self-pay | Admitting: Family Medicine

## 2014-01-04 ENCOUNTER — Encounter: Payer: Self-pay | Admitting: Family Medicine

## 2014-01-04 ENCOUNTER — Ambulatory Visit (INDEPENDENT_AMBULATORY_CARE_PROVIDER_SITE_OTHER): Payer: Medicare Other | Admitting: Family Medicine

## 2014-01-04 VITALS — BP 150/92 | HR 74 | Temp 98.5°F | Ht 72.5 in | Wt 368.5 lb

## 2014-01-04 DIAGNOSIS — I10 Essential (primary) hypertension: Secondary | ICD-10-CM

## 2014-01-04 DIAGNOSIS — G4733 Obstructive sleep apnea (adult) (pediatric): Secondary | ICD-10-CM

## 2014-01-04 DIAGNOSIS — R21 Rash and other nonspecific skin eruption: Secondary | ICD-10-CM | POA: Insufficient documentation

## 2014-01-04 DIAGNOSIS — G894 Chronic pain syndrome: Secondary | ICD-10-CM

## 2014-01-04 DIAGNOSIS — Z136 Encounter for screening for cardiovascular disorders: Secondary | ICD-10-CM

## 2014-01-04 DIAGNOSIS — Z0000 Encounter for general adult medical examination without abnormal findings: Secondary | ICD-10-CM | POA: Insufficient documentation

## 2014-01-04 DIAGNOSIS — Z125 Encounter for screening for malignant neoplasm of prostate: Secondary | ICD-10-CM

## 2014-01-04 DIAGNOSIS — F331 Major depressive disorder, recurrent, moderate: Secondary | ICD-10-CM

## 2014-01-04 LAB — COMPREHENSIVE METABOLIC PANEL
ALT: 18 U/L (ref 0–53)
AST: 18 U/L (ref 0–37)
Albumin: 3.8 g/dL (ref 3.5–5.2)
Alkaline Phosphatase: 116 U/L (ref 39–117)
BILIRUBIN TOTAL: 0.7 mg/dL (ref 0.2–1.2)
BUN: 14 mg/dL (ref 6–23)
CO2: 29 mEq/L (ref 19–32)
Calcium: 9.2 mg/dL (ref 8.4–10.5)
Chloride: 102 mEq/L (ref 96–112)
Creatinine, Ser: 0.9 mg/dL (ref 0.4–1.5)
GFR: 98.24 mL/min (ref >60.00)
Glucose, Bld: 90 mg/dL (ref 70–99)
Potassium: 4.7 mEq/L (ref 3.5–5.1)
SODIUM: 137 meq/L (ref 135–145)
Total Protein: 6.7 g/dL (ref 6.0–8.3)

## 2014-01-04 LAB — LIPID PANEL
Cholesterol: 207 mg/dL — ABNORMAL HIGH (ref 0–200)
HDL: 36.8 mg/dL — ABNORMAL LOW (ref >39.00)
LDL CALC: 140 mg/dL — AB (ref 0–99)
NonHDL: 170.2
Total CHOL/HDL Ratio: 6
Triglycerides: 153 mg/dL — ABNORMAL HIGH (ref 0.0–149.0)
VLDL: 30.6 mg/dL (ref 0.0–40.0)

## 2014-01-04 LAB — CBC WITH DIFFERENTIAL/PLATELET
Basophils Absolute: 0 10*3/uL (ref 0.0–0.1)
Basophils Relative: 0.5 % (ref 0.0–3.0)
EOS ABS: 0.3 10*3/uL (ref 0.0–0.7)
Eosinophils Relative: 3.4 % (ref 0.0–5.0)
HCT: 39.6 % (ref 39.0–52.0)
Hemoglobin: 12.9 g/dL — ABNORMAL LOW (ref 13.0–17.0)
Lymphocytes Relative: 20.5 % (ref 12.0–46.0)
Lymphs Abs: 2.1 10*3/uL (ref 0.7–4.0)
MCHC: 32.5 g/dL (ref 30.0–36.0)
MCV: 76.8 fl — AB (ref 78.0–100.0)
MONO ABS: 0.5 10*3/uL (ref 0.1–1.0)
Monocytes Relative: 4.8 % (ref 3.0–12.0)
NEUTROS ABS: 7.1 10*3/uL (ref 1.4–7.7)
NEUTROS PCT: 70.8 % (ref 43.0–77.0)
Platelets: 282 10*3/uL (ref 150.0–400.0)
RBC: 5.15 Mil/uL (ref 4.22–5.81)
RDW: 16 % — ABNORMAL HIGH (ref 11.5–15.5)
WBC: 10.1 10*3/uL (ref 4.0–10.5)

## 2014-01-04 LAB — PSA: PSA: 1.49 ng/mL (ref 0.10–4.00)

## 2014-01-04 MED ORDER — OXYCODONE HCL 5 MG PO TABS: 5.0000 mg | ORAL_TABLET | ORAL | Status: DC | PRN

## 2014-01-04 MED ORDER — FLUOCINONIDE-E 0.05 % EX CREA: 1.0000 "application " | TOPICAL_CREAM | Freq: Two times a day (BID) | CUTANEOUS | Status: DC

## 2014-01-04 MED ORDER — METOPROLOL SUCCINATE ER 25 MG PO TB24: ORAL_TABLET | ORAL | Status: DC

## 2014-01-04 NOTE — Patient Instructions (Addendum)
Good to see you. Please make an appointment with Dr. Gwenette Greet.  Please try lidex on your rash twice daily for no more than 2 weeks- please call me with an update.  Call Dr. Sherwood Gambler to get an appointment.

## 2014-01-04 NOTE — Progress Notes (Signed)
Pre visit review using our clinic review tool, if applicable. No additional management support is needed unless otherwise documented below in the visit note. 

## 2014-01-04 NOTE — Assessment & Plan Note (Signed)
Allergic/contact dermatitis vs eczema- erx sent for lidex to use twice daily x 2 weeks. Call or return to clinic prn if these symptoms worsen or fail to improve as anticipated. The patient indicates understanding of these issues and agrees with the plan.

## 2014-01-04 NOTE — Assessment & Plan Note (Signed)
Tramadol no longer effective. Rx given for oxycodone 5 mg for severe pain- discussed sedation and addiction potential.

## 2014-01-04 NOTE — Assessment & Plan Note (Signed)
Discussed results. Advised follow up with Dr. Gwenette Greet to get CPAP.

## 2014-01-04 NOTE — Assessment & Plan Note (Signed)
Deteriorated likely due to acute pain and stressors. Asymptomatic- normotensive at previous two appointments. No changes today. Continue to monitor.

## 2014-01-04 NOTE — Progress Notes (Signed)
43  yo male here for medicare wellness visit.  I have personally reviewed the Medicare Annual Wellness questionnaire and have noted 1. The patient's medical and social history 2. Their use of alcohol, tobacco or illicit drugs 3. Their current medications and supplements 4. The patient's functional ability including ADL's, fall risks, home safety risks and hearing or visual             impairment. 5. Diet and physical activities 6. Evidence for depression or mood disorders  End of life wishes discussed and updated in Social History.  The roster of all physicians providing medical care to patient - is listed in the Snapshot section of the chart.  Had two grand daughter last fall. Helping to care for them.   OSA-  Saw Dr. Gwenette Greet in January of these year for OSA- note reviewed.  Sleep study on 08/22/13- Moderate sleep apnea.  He did not want to pay copay to get his results.  Wants me to tell him about them.  HTN- BP high today.  Reports he has been under a lot of stress at home.  Taking care of grand baby, wife just had surgery and they live in a 2 bedroom.  Denies HA or blurred vision. BP Readings from Last 3 Encounters:  01/04/14 150/92  07/27/13 128/80  06/16/13 126/84    He is in significant pain- h/o L5 fusion (per pt, "didn't take."). States he is in pain constantly.  Takes flexeril and tramadol and feels they take the edge off but nothing takes away the pain.  States that Dr. Rita Ohara has stated he is not a candidate for further surgeries.  Tobacco abuse- quit smoking in 3/10 before his spinal surgery and has not restarted.   Morbidly obese-  States he cannot exercise due to his back pain and his pain worsens in cold weather.  Has thought about gastric surgery but states he know that he cannot stick to lifestyle changes- he does not like to eat vegetables. Wt Readings from Last 3 Encounters:  01/04/14 368 lb 8 oz (167.151 kg)  07/27/13 380 lb (172.367 kg)  06/16/13 371 lb  (168.284 kg)    He does feel his depression and anxiety is controlled with paxil- "as good as it can be."  Does feel down at times but would never take his own life- loves being a grandfather.  Rash on arms bilaterally- itchy.  STarted a week ago.  Has not been outside.  Not spreading.  Patient Active Problem List   Diagnosis Date Noted  . Medicare annual wellness visit, initial 01/04/2014  . Balanitis 06/16/2013  . OSA (obstructive sleep apnea) 06/16/2013  . Urethral stricture 06/12/2013  . Former tobacco use 06/12/2013  . Hematuria 06/12/2013  . Chronic fatigue 06/12/2013  . Tachycardia 07/15/2012  . HTN (hypertension) 07/15/2012  . Morbid obesity 11/20/2009  . MAJOR DPRSV DISORDER RECURRENT EPISODE MODERATE 03/28/2009   Past Medical History  Diagnosis Date  . Major depressive disorder, recurrent episode, moderate   . Morbid obesity   . Chronic pain   . Anxiety   . Lumbar disc disease with radiculopathy     sees Dr. Rita Ohara  . HTN (hypertension)   . Tachycardia     a. 07/2012 - placed on BB. CT angio neg.  . Migraine headache   . Chronic fatigue   . Kidney stone   . Urethral stricture     Was told he would need pediatric cath if ever had urinary cath  .  Dyslipidemia    Past Surgical History  Procedure Laterality Date  . Lumbar fusion  09/2008    Dr. Sherwood Gambler  . Fatty tumor removal    . Cardiac catheterization  05/2013    minimal coronary plaque with normal LV fxn (hochrein)   History  Substance Use Topics  . Smoking status: Former Smoker -- 30 years    Quit date: 01/24/2009  . Smokeless tobacco: Never Used     Comment: 1/2 ppd-2ppd - quit 2010.  Marland Kitchen Alcohol Use: Yes     Comment: Rarel beer. 1-2/yr   Family History  Problem Relation Age of Onset  . Hypertension Mother   . Cancer Paternal Grandfather     lung  . CAD Maternal Grandmother     age 73 - CABG.  . Fibromyalgia      Sister and daughter  . Allergy (severe)     No Known Allergies Current  Outpatient Prescriptions on File Prior to Visit  Medication Sig Dispense Refill  . clotrimazole (LOTRIMIN) 1 % cream Apply 1 application topically 2 (two) times daily.  30 g  0  . cyclobenzaprine (FLEXERIL) 10 MG tablet Take 10 mg by mouth 2 (two) times daily as needed for muscle spasms.       Marland Kitchen lisinopril (PRINIVIL,ZESTRIL) 20 MG tablet TAKE 1 TABLET BY MOUTH DAILY.  30 tablet  11  . Multiple Vitamin (MULTIVITAMIN) tablet Take 1 tablet by mouth daily.      . naproxen sodium (ANAPROX) 220 MG tablet Take 2 by mouth twice a day 3 to 5 days a week      . OVER THE COUNTER MEDICATION Take 2 tablets by mouth at bedtime as needed (for pain/sleep. Takes Aleve PM).      Marland Kitchen PARoxetine (PAXIL) 20 MG tablet TAKE 1 TABLET BY MOUTH ONCE A DAY  30 tablet  2  . Pseudoephedrine-APAP-DM (DAYQUIL PO) Take 2 capsules by mouth every 6 (six) hours as needed (for cold symptoms).      . traMADol (ULTRAM) 50 MG tablet Take 2 tablets (100 mg total) by mouth every 6 (six) hours as needed for moderate pain.  30 tablet  3   No current facility-administered medications on file prior to visit.   The PMH, PSH, Social History, Family History, Medications, and allergies have been reviewed in [facility], and have been updated if relevant.   Review of Systems  See HPI   No CP No SOB Physical Exam  BP 150/92  Pulse 74  Temp(Src) 98.5 F (36.9 C) (Oral)  Ht 6' 0.5" (1.842 m)  Wt 368 lb 8 oz (167.151 kg)  BMI 49.26 kg/m2  SpO2 96% Wt Readings from Last 3 Encounters:  01/04/14 368 lb 8 oz (167.151 kg)  07/27/13 380 lb (172.367 kg)  06/16/13 371 lb (168.284 kg)     General: morbidly obese, Well-developed,well-nourished,in no acute distress; alert,appropriate and cooperative throughout examination, obese  Lungs: Normal respiratory effort, chest expands symmetrically. Lungs are clear to auscultation, no crackles or wheezes.  Heart: NSR. S1 and S2 normal without gallop, murmur, click, rub or other extra sounds.  Extremities:  No clubbing, cyanosis, edema, or deformity noted with normal full range of motion of all joints.  Psych: Oriented X3, memory intact for recent and remote, normally interactive, good eye contact, and depressed affect.  Skin:  Elevated rash on forearms bilaterally, no pustules, no scaling but does appear dry.

## 2014-01-04 NOTE — Assessment & Plan Note (Signed)
Does have worsening stressors and chronic pain. Feels he is coping ok. Advised to make appt with Dr. Sherwood Gambler.

## 2014-01-04 NOTE — Assessment & Plan Note (Addendum)
The patients weight, height, BMI and visual acuity have been recorded in the chart I have made referrals, counseling and provided education to the patient based review of the above and I have provided the pt with a written personalized care plan for preventive services.  Orders Placed This Encounter  Procedures  . CBC with Differential  . Comprehensive metabolic panel  . Lipid panel  . PSA

## 2014-01-05 ENCOUNTER — Telehealth: Payer: Self-pay | Admitting: Family Medicine

## 2014-01-05 NOTE — Telephone Encounter (Signed)
Relevant patient education assigned to patient using Emmi. ° °

## 2014-01-26 ENCOUNTER — Encounter: Payer: Self-pay | Admitting: Pulmonary Disease

## 2014-01-26 ENCOUNTER — Ambulatory Visit (INDEPENDENT_AMBULATORY_CARE_PROVIDER_SITE_OTHER): Payer: Medicare Other | Admitting: Pulmonary Disease

## 2014-01-26 VITALS — BP 136/74 | HR 74 | Temp 98.5°F | Ht 73.0 in | Wt 370.0 lb

## 2014-01-26 DIAGNOSIS — G4733 Obstructive sleep apnea (adult) (pediatric): Secondary | ICD-10-CM

## 2014-01-26 NOTE — Progress Notes (Signed)
   Subjective:    Patient ID: Richard Mcguire, male    DOB: October 03, 1970, 43 y.o.   MRN: [id number]  HPI Patient comes in today for followup of his recent home sleep test. He was found to have moderate OSA, with an AHI of 25 events per hour. I have reviewed the study with him in detail, and answered all of his questions.   Review of Systems  Constitutional: Negative for fever and unexpected weight change.  HENT: Positive for postnasal drip and sinus pressure. Negative for congestion, dental problem, ear pain, nosebleeds, rhinorrhea, sneezing, sore throat and trouble swallowing.   Eyes: Negative for redness and itching.  Respiratory: Negative for cough, chest tightness, shortness of breath and wheezing.   Cardiovascular: Negative for palpitations and leg swelling.  Gastrointestinal: Negative for nausea and vomiting.  Genitourinary: Negative for dysuria.  Musculoskeletal: Negative for joint swelling.  Skin: Negative for rash.  Neurological: Negative for headaches.  Hematological: Does not bruise/bleed easily.  Psychiatric/Behavioral: Negative for dysphoric mood. The patient is not nervous/anxious.        Objective:   Physical Exam Obese male in no acute distress Nose without purulence or discharge noted Neck without lymphadenopathy or thyromegaly Lower extremities with mild edema, no cyanosis Alert and oriented, moves all 4 extremities.       Assessment & Plan:

## 2014-01-26 NOTE — Assessment & Plan Note (Signed)
The patient has moderate obstructive sleep apnea by his recent home sleep test, and he definitely has some impact to his quality of life as well as difficult to control hypertension. I have reviewed with him the various treatment options for moderate OSA, including a trial of weight loss alone, dental appliance, as well as CPAP. After a long discussion, he has decided to try CPAP while working on weight reduction. I will set the patient up on cpap at a moderate pressure level to allow for desensitization, and will troubleshoot the device over the next 4-6weeks if needed.  The pt is to call me if having issues with tolerance.  Will then optimize the pressure once patient is able to wear cpap on a consistent basis.

## 2014-01-26 NOTE — Patient Instructions (Signed)
Will start on cpap at a moderate pressure level.  Please call if having issues with tolerance Work on weight loss followup with me again in 8 weeks.

## 2014-02-07 ENCOUNTER — Ambulatory Visit (INDEPENDENT_AMBULATORY_CARE_PROVIDER_SITE_OTHER): Payer: Medicare Other | Admitting: Family Medicine

## 2014-02-07 ENCOUNTER — Encounter: Payer: Self-pay | Admitting: Family Medicine

## 2014-02-07 VITALS — BP 138/78 | HR 83 | Temp 98.4°F | Wt 367.5 lb

## 2014-02-07 DIAGNOSIS — Z9889 Other specified postprocedural states: Secondary | ICD-10-CM

## 2014-02-07 DIAGNOSIS — Z981 Arthrodesis status: Secondary | ICD-10-CM | POA: Insufficient documentation

## 2014-02-07 DIAGNOSIS — G894 Chronic pain syndrome: Secondary | ICD-10-CM

## 2014-02-07 MED ORDER — OXYCODONE HCL 5 MG PO TABS: ORAL_TABLET | ORAL | Status: DC

## 2014-02-07 MED ORDER — OXYCODONE HCL 5 MG PO TABS: 5.0000 mg | ORAL_TABLET | ORAL | Status: DC | PRN

## 2014-02-07 NOTE — Assessment & Plan Note (Signed)
Change rx- he is taking at night only. Oxycodone 5 mg - 1-2 tabs po prn pain at bedtime.

## 2014-02-07 NOTE — Progress Notes (Signed)
43  yo male here to discuss his meds/back pain  He is in significant pain- h/o L5 fusion (per pt, "didn't take."). States he is in pain constantly.  Takes flexeril and tramadol and feels they take the edge off but nothing takes away the pain.  States that Dr. Rita Ohara has stated he is not a candidate for further surgeries.  Feels he needs a second opinion.  Started Oxycodone 5 mg every 4 hours as needed- taking 2 tablets at bedtime and that helps him sleep.  Patient Active Problem List   Diagnosis Date Noted  . Medicare annual wellness visit, initial 01/04/2014  . Rash and nonspecific skin eruption 01/04/2014  . Chronic pain syndrome 01/04/2014  . Balanitis 06/16/2013  . OSA (obstructive sleep apnea) 06/16/2013  . Urethral stricture 06/12/2013  . Former tobacco use 06/12/2013  . Hematuria 06/12/2013  . Chronic fatigue 06/12/2013  . Tachycardia 07/15/2012  . HTN (hypertension) 07/15/2012  . Morbid obesity 11/20/2009  . MAJOR DPRSV DISORDER RECURRENT EPISODE MODERATE 03/28/2009   Past Medical History  Diagnosis Date  . Major depressive disorder, recurrent episode, moderate   . Morbid obesity   . Chronic pain   . Anxiety   . Lumbar disc disease with radiculopathy     sees Dr. Rita Ohara  . HTN (hypertension)   . Tachycardia     a. 07/2012 - placed on BB. CT angio neg.  . Migraine headache   . Chronic fatigue   . Kidney stone   . Urethral stricture     Was told he would need pediatric cath if ever had urinary cath  . Dyslipidemia    Past Surgical History  Procedure Laterality Date  . Lumbar fusion  09/2008    Dr. Sherwood Gambler  . Fatty tumor removal    . Cardiac catheterization  05/2013    minimal coronary plaque with normal LV fxn (hochrein)   History  Substance Use Topics  . Smoking status: Former Smoker -- 30 years    Quit date: 01/24/2009  . Smokeless tobacco: Never Used     Comment: 1/2 ppd-2ppd - quit 2010.  Marland Kitchen Alcohol Use: Yes     Comment: Rarel beer. 1-2/yr    Family History  Problem Relation Age of Onset  . Hypertension Mother   . Cancer Paternal Grandfather     lung  . CAD Maternal Grandmother     age 97 - CABG.  . Fibromyalgia      Sister and daughter  . Allergy (severe)     No Known Allergies Current Outpatient Prescriptions on File Prior to Visit  Medication Sig Dispense Refill  . clotrimazole (LOTRIMIN) 1 % cream Apply 1 application topically 2 (two) times daily.  30 g  0  . cyclobenzaprine (FLEXERIL) 10 MG tablet Take 10 mg by mouth 2 (two) times daily as needed for muscle spasms.       . fluocinonide-emollient (LIDEX-E) 0.05 % cream Apply 1 application topically 2 (two) times daily.  30 g  0  . lisinopril (PRINIVIL,ZESTRIL) 20 MG tablet TAKE 1 TABLET BY MOUTH DAILY.  30 tablet  11  . metoprolol succinate (TOPROL-XL) 25 MG 24 hr tablet TAKE 1/2 TABLET BY MOUTH DAILY  15 tablet  3  . Multiple Vitamin (MULTIVITAMIN) tablet Take 1 tablet by mouth daily.      . naproxen sodium (ANAPROX) 220 MG tablet Take 2 by mouth twice a day 3 to 5 days a week      . PARoxetine (PAXIL) 20  MG tablet TAKE 1 TABLET BY MOUTH ONCE A DAY  30 tablet  2  . Pseudoephedrine-APAP-DM (DAYQUIL PO) Take 2 capsules by mouth every morning.        No current facility-administered medications on file prior to visit.   The PMH, PSH, Social History, Family History, Medications, and allergies have been reviewed in [facility], and have been updated if relevant.   Review of Systems  See HPI   No nausea or dizziness  Physical Exam  BP 138/78  Pulse 83  Temp(Src) 98.4 F (36.9 C) (Oral)  Wt 367 lb 8 oz (166.697 kg)  SpO2 97% Wt Readings from Last 3 Encounters:  02/07/14 367 lb 8 oz (166.697 kg)  01/26/14 370 lb (167.831 kg)  01/04/14 368 lb 8 oz (167.151 kg)   General: morbidly obese, Well-developed,well-nourished,in no acute distress; alert,appropriate and cooperative throughout examination, obese  Psych: Oriented X3, memory intact for recent and remote, normally  interactive, good eye contact, and depressed affect.  Skin:  Elevated rash on forearms bilaterally, no pustules, no scaling but does appear dry.

## 2014-02-07 NOTE — Patient Instructions (Addendum)
Ask your daughter if she has tried diclegis.  We will call you with your neurosurgery referral.  Hang in there.

## 2014-02-07 NOTE — Progress Notes (Signed)
Pre visit review using our clinic review tool, if applicable. No additional management support is needed unless otherwise documented below in the visit note. 

## 2014-02-07 NOTE — Assessment & Plan Note (Signed)
Neurosurg referral placed for a second opinion.

## 2014-03-07 ENCOUNTER — Telehealth: Payer: Self-pay | Admitting: Family Medicine

## 2014-03-07 DIAGNOSIS — M5136 Other intervertebral disc degeneration, lumbar region: Secondary | ICD-10-CM

## 2014-03-07 NOTE — Telephone Encounter (Signed)
Received the previous records from Dr Sherwood Gambler for 2nd opinion Neurosurgery at [facility]. Faxed referral to [facility] and they called back saying that they require a current MRI within 47months in order to consider seeing the patient. Records from Dr Sherwood Gambler on your desk. Last MRI was 2010 please advise.

## 2014-03-08 NOTE — Telephone Encounter (Signed)
If I ordered the MRI, I doubt it would be covered right?  Isn't this something his neurosurgeon would need to order?

## 2014-03-11 ENCOUNTER — Other Ambulatory Visit: Payer: Self-pay | Admitting: Family Medicine

## 2014-03-12 NOTE — Telephone Encounter (Signed)
Many Neurosurgical groups want the patient to have an MRI done first if medically necessary to determine if it is a Neurosurgical problem before they will make them an appt.

## 2014-03-14 NOTE — Telephone Encounter (Signed)
Order entered

## 2014-03-26 ENCOUNTER — Ambulatory Visit: Payer: Medicare Other | Admitting: Pulmonary Disease

## 2014-03-28 ENCOUNTER — Ambulatory Visit
Admission: RE | Admit: 2014-03-28 | Discharge: 2014-03-28 | Disposition: A | Discharge status: Home / Self Care | Payer: Medicare Other | Source: Ambulatory Visit | Attending: Family Medicine | Admitting: Family Medicine

## 2014-03-28 DIAGNOSIS — M5136 Other intervertebral disc degeneration, lumbar region: Secondary | ICD-10-CM

## 2014-03-29 ENCOUNTER — Other Ambulatory Visit: Payer: Self-pay | Admitting: *Deleted

## 2014-03-29 NOTE — Telephone Encounter (Signed)
Which medication ?

## 2014-03-29 NOTE — Telephone Encounter (Signed)
Pt requesting medication refill. Last f/u appt 01/2014 with no future appts scheduled. pls advise

## 2014-03-30 MED ORDER — OXYCODONE HCL 5 MG PO TABS: ORAL_TABLET | ORAL | Status: DC

## 2014-03-30 NOTE — Addendum Note (Signed)
Addended by: Modena Nunnery on: 03/30/2014 09:40 AM   Modules accepted: Orders

## 2014-03-30 NOTE — Telephone Encounter (Signed)
Spoke to pt and informed him Rx will be available for pickup after 1400. 

## 2014-05-04 ENCOUNTER — Other Ambulatory Visit: Payer: Self-pay | Admitting: *Deleted

## 2014-05-04 MED ORDER — PAROXETINE HCL 20 MG PO TABS: ORAL_TABLET | ORAL | Status: DC

## 2014-05-04 MED ORDER — METOPROLOL SUCCINATE ER 25 MG PO TB24: ORAL_TABLET | ORAL | Status: DC

## 2014-05-10 ENCOUNTER — Other Ambulatory Visit: Payer: Self-pay

## 2014-05-10 NOTE — Telephone Encounter (Signed)
Pt left v/m requesting rx oxycodone. Call when ready for pick up. 

## 2014-05-11 ENCOUNTER — Encounter: Payer: Self-pay | Admitting: Family Medicine

## 2014-05-11 MED ORDER — OXYCODONE HCL 5 MG PO TABS: ORAL_TABLET | ORAL | Status: DC

## 2014-05-11 NOTE — Telephone Encounter (Signed)
Spoke to pt and informed him Rx is available for pickup. Pt advised 3rd party unable to pickup

## 2014-05-11 NOTE — Telephone Encounter (Signed)
Reviewed Dr. Hulen Shouts last note. RX printed and signed, given to [facility]

## 2014-05-22 ENCOUNTER — Encounter: Payer: Self-pay | Admitting: Family Medicine

## 2014-06-18 ENCOUNTER — Other Ambulatory Visit: Payer: Self-pay

## 2014-06-18 MED ORDER — OXYCODONE HCL 5 MG PO TABS: ORAL_TABLET | ORAL | Status: DC

## 2014-06-18 NOTE — Telephone Encounter (Signed)
Pt left v/m requesting rx oxycodone. Call when ready for pick up. Pt last seen 02/07/14.

## 2014-06-18 NOTE — Telephone Encounter (Signed)
Spoke to pt and informed him Rx is available for pickup from the front desk 

## 2014-06-21 ENCOUNTER — Encounter (HOSPITAL_COMMUNITY): Payer: Self-pay | Admitting: Cardiology

## 2014-06-27 ENCOUNTER — Other Ambulatory Visit: Payer: Self-pay | Admitting: *Deleted

## 2014-06-27 MED ORDER — METOPROLOL SUCCINATE ER 25 MG PO TB24: ORAL_TABLET | ORAL | Status: DC

## 2014-06-27 MED ORDER — PAROXETINE HCL 20 MG PO TABS: ORAL_TABLET | ORAL | Status: DC

## 2014-07-29 ENCOUNTER — Emergency Department: Payer: Self-pay | Admitting: Physician Assistant

## 2014-08-06 ENCOUNTER — Emergency Department (HOSPITAL_COMMUNITY)
Admission: EM | Admit: 2014-08-06 | Discharge: 2014-08-06 | Disposition: A | Discharge status: Home / Self Care | Payer: Medicare Other | Attending: Emergency Medicine | Admitting: Emergency Medicine

## 2014-08-06 ENCOUNTER — Ambulatory Visit (INDEPENDENT_AMBULATORY_CARE_PROVIDER_SITE_OTHER): Payer: Medicare Other | Admitting: Family Medicine

## 2014-08-06 ENCOUNTER — Encounter: Payer: Self-pay | Admitting: Family Medicine

## 2014-08-06 ENCOUNTER — Encounter (HOSPITAL_COMMUNITY): Payer: Self-pay | Admitting: Emergency Medicine

## 2014-08-06 ENCOUNTER — Emergency Department (HOSPITAL_COMMUNITY): Payer: Medicare Other

## 2014-08-06 VITALS — BP 126/70 | HR 140 | Temp 98.9°F | Wt 356.5 lb

## 2014-08-06 DIAGNOSIS — Z79899 Other long term (current) drug therapy: Secondary | ICD-10-CM | POA: Insufficient documentation

## 2014-08-06 DIAGNOSIS — R197 Diarrhea, unspecified: Secondary | ICD-10-CM | POA: Insufficient documentation

## 2014-08-06 DIAGNOSIS — A419 Sepsis, unspecified organism: Secondary | ICD-10-CM

## 2014-08-06 DIAGNOSIS — Z791 Long term (current) use of non-steroidal anti-inflammatories (NSAID): Secondary | ICD-10-CM | POA: Diagnosis not present

## 2014-08-06 DIAGNOSIS — R000 Tachycardia, unspecified: Secondary | ICD-10-CM | POA: Diagnosis not present

## 2014-08-06 DIAGNOSIS — F419 Anxiety disorder, unspecified: Secondary | ICD-10-CM | POA: Diagnosis not present

## 2014-08-06 DIAGNOSIS — R079 Chest pain, unspecified: Secondary | ICD-10-CM | POA: Diagnosis present

## 2014-08-06 DIAGNOSIS — R21 Rash and other nonspecific skin eruption: Secondary | ICD-10-CM | POA: Insufficient documentation

## 2014-08-06 DIAGNOSIS — R0602 Shortness of breath: Secondary | ICD-10-CM | POA: Insufficient documentation

## 2014-08-06 DIAGNOSIS — R6510 Systemic inflammatory response syndrome (SIRS) of non-infectious origin without acute organ dysfunction: Secondary | ICD-10-CM | POA: Insufficient documentation

## 2014-08-06 DIAGNOSIS — G8929 Other chronic pain: Secondary | ICD-10-CM | POA: Insufficient documentation

## 2014-08-06 DIAGNOSIS — R509 Fever, unspecified: Secondary | ICD-10-CM | POA: Diagnosis not present

## 2014-08-06 DIAGNOSIS — R059 Cough, unspecified: Secondary | ICD-10-CM | POA: Insufficient documentation

## 2014-08-06 DIAGNOSIS — F329 Major depressive disorder, single episode, unspecified: Secondary | ICD-10-CM | POA: Insufficient documentation

## 2014-08-06 DIAGNOSIS — Z87891 Personal history of nicotine dependence: Secondary | ICD-10-CM | POA: Diagnosis not present

## 2014-08-06 DIAGNOSIS — R112 Nausea with vomiting, unspecified: Secondary | ICD-10-CM | POA: Insufficient documentation

## 2014-08-06 DIAGNOSIS — I10 Essential (primary) hypertension: Secondary | ICD-10-CM | POA: Insufficient documentation

## 2014-08-06 DIAGNOSIS — R05 Cough: Secondary | ICD-10-CM

## 2014-08-06 DIAGNOSIS — Z87442 Personal history of urinary calculi: Secondary | ICD-10-CM | POA: Insufficient documentation

## 2014-08-06 LAB — CBC
HCT: 41.1 % (ref 39.0–52.0)
HEMOGLOBIN: 13.8 g/dL (ref 13.0–17.0)
MCH: 25 pg — AB (ref 26.0–34.0)
MCHC: 33.6 g/dL (ref 30.0–36.0)
MCV: 74.5 fL — ABNORMAL LOW (ref 78.0–100.0)
Platelets: 196 10*3/uL (ref 150–400)
RBC: 5.52 MIL/uL (ref 4.22–5.81)
RDW: 15.9 % — ABNORMAL HIGH (ref 11.5–15.5)
WBC: 10.7 10*3/uL — ABNORMAL HIGH (ref 4.0–10.5)

## 2014-08-06 LAB — I-STAT CHEM 8, ED
BUN: 9 mg/dL (ref 6–23)
CALCIUM ION: 1.02 mmol/L — AB (ref 1.12–1.23)
Chloride: 98 mmol/L (ref 96–112)
Creatinine, Ser: 0.9 mg/dL (ref 0.50–1.35)
Glucose, Bld: 110 mg/dL — ABNORMAL HIGH (ref 70–99)
HCT: 44 % (ref 39.0–52.0)
Hemoglobin: 15 g/dL (ref 13.0–17.0)
Potassium: 4.2 mmol/L (ref 3.5–5.1)
SODIUM: 129 mmol/L — AB (ref 135–145)
TCO2: 21 mmol/L (ref 0–100)

## 2014-08-06 LAB — URINALYSIS, ROUTINE W REFLEX MICROSCOPIC
Glucose, UA: NEGATIVE mg/dL
Hgb urine dipstick: NEGATIVE
KETONES UR: 15 mg/dL — AB
NITRITE: NEGATIVE
Protein, ur: NEGATIVE mg/dL
Specific Gravity, Urine: 1.024 (ref 1.005–1.030)
Urobilinogen, UA: 0.2 mg/dL (ref 0.0–1.0)
pH: 5 (ref 5.0–8.0)

## 2014-08-06 LAB — BRAIN NATRIURETIC PEPTIDE: B Natriuretic Peptide: 9.4 pg/mL (ref 0.0–100.0)

## 2014-08-06 LAB — BASIC METABOLIC PANEL
Anion gap: 10 (ref 5–15)
BUN: 7 mg/dL (ref 6–23)
CALCIUM: 8.6 mg/dL (ref 8.4–10.5)
CHLORIDE: 98 mmol/L (ref 96–112)
CO2: 23 mmol/L (ref 19–32)
CREATININE: 0.95 mg/dL (ref 0.50–1.35)
Glucose, Bld: 114 mg/dL — ABNORMAL HIGH (ref 70–99)
POTASSIUM: 4 mmol/L (ref 3.5–5.1)
SODIUM: 131 mmol/L — AB (ref 135–145)

## 2014-08-06 LAB — I-STAT TROPONIN, ED: Troponin i, poc: 0 ng/mL (ref 0.00–0.08)

## 2014-08-06 LAB — INFLUENZA PANEL BY PCR (TYPE A & B)
H1N1 flu by pcr: NOT DETECTED
INFLAPCR: NEGATIVE
INFLBPCR: NEGATIVE

## 2014-08-06 LAB — URINE MICROSCOPIC-ADD ON

## 2014-08-06 MED ORDER — OXYCODONE HCL 5 MG PO TABS: ORAL_TABLET | ORAL | Status: DC

## 2014-08-06 MED ORDER — SODIUM CHLORIDE 0.9 % IV BOLUS (SEPSIS)
1000.0000 mL | Freq: Once | INTRAVENOUS | Status: AC
  Administered 2014-08-06: 1000 mL via INTRAVENOUS

## 2014-08-06 MED ORDER — ACETAMINOPHEN 325 MG PO TABS
650.0000 mg | ORAL_TABLET | Freq: Once | ORAL | Status: AC
  Administered 2014-08-06: 650 mg via ORAL
  Filled 2014-08-06: qty 2

## 2014-08-06 MED ORDER — SODIUM CHLORIDE 0.9 % IV BOLUS (SEPSIS)
1000.0000 mL | Freq: Once | INTRAVENOUS | Status: AC
Start: 2014-08-06 — End: 2014-08-06
  Administered 2014-08-06: 1000 mL via INTRAVENOUS

## 2014-08-06 MED ORDER — SODIUM CHLORIDE 0.9 % IV SOLN
Freq: Once | INTRAVENOUS | Status: AC
  Administered 2014-08-06: 14:00:00 via INTRAVENOUS

## 2014-08-06 MED ORDER — DEXTROSE 5 % IV SOLN
1.0000 g | Freq: Once | INTRAVENOUS | Status: AC
  Administered 2014-08-06: 1 g via INTRAVENOUS
  Filled 2014-08-06: qty 10

## 2014-08-06 NOTE — ED Notes (Signed)
Pt made aware to return if symptoms worsen or if any life threatening symptoms occur.   

## 2014-08-06 NOTE — ED Notes (Signed)
Pt c/o SOB and CP x several days with some flu like sx with fever per pt; pt sent here by PCP for eval of CP and rash under arms; pt sts some N/V and appears pale

## 2014-08-06 NOTE — Discharge Instructions (Signed)
Follow-up blood culture results and for reassessment with her primary doctor.  If you were given medicines take as directed.  If you are on coumadin or contraceptives realize their levels and effectiveness is altered by many different medicines.  If you have any reaction (rash, tongues swelling, other) to the medicines stop taking and see a physician.   Please follow up as directed and return to the ER or see a physician for new or worsening symptoms.  Thank you. Filed Vitals:   08/06/14 1330 08/06/14 1345 08/06/14 1400 08/06/14 1415  BP: 120/60 115/63 106/57 116/63  Pulse: 92 96 93 100  Temp:      TempSrc:      Resp: 10 14 16 19   SpO2: 96% 95% 95% 97%

## 2014-08-06 NOTE — ED Provider Notes (Signed)
CSN: [id number]     Arrival date & time 08/06/14  1002 History   First MD Initiated Contact with Patient 08/06/14 1030     Chief Complaint  Patient presents with  . Chest Pain  . Shortness of Breath     (Consider location/radiation/quality/duration/timing/severity/associated sxs/prior Treatment) HPI Comments: 44 year old male with morbid obesity, depression, high blood pressure, chronic fatigue, chronic pain presents for worsening cough, shortness of breath and general fatigue. Patient symptoms all started approximately Friday productive cough, intermittent fevers and body aches. Patient's symptoms gradually worsened since then. No current antibiotics. Patient has had a rash being treated with prednisone under his arms. No cardiac or lung disease history. No recent travel. Nothing improves his symptoms. No recent hospitalization, blood clot history, recent surgery, unilateral leg symptoms or active cancer.  Patient is a 44 y.o. male presenting with chest pain and shortness of breath. The history is provided by the patient.  Chest Pain Associated symptoms: cough, fatigue, fever, nausea, shortness of breath and vomiting   Associated symptoms: no abdominal pain, no back pain and no headache   Shortness of Breath Associated symptoms: chest pain (worse with coughing bilateral anterior chest), cough, fever, rash and vomiting   Associated symptoms: no abdominal pain, no headaches and no neck pain     Past Medical History  Diagnosis Date  . Major depressive disorder, recurrent episode, moderate   . Morbid obesity   . Chronic pain   . Anxiety   . Lumbar disc disease with radiculopathy     sees Dr. Rita Ohara  . HTN (hypertension)   . Tachycardia     a. 07/2012 - placed on BB. CT angio neg.  . Migraine headache   . Chronic fatigue   . Kidney stone   . Urethral stricture     Was told he would need pediatric cath if ever had urinary cath  . Dyslipidemia    Past Surgical History  Procedure  Laterality Date  . Lumbar fusion  09/2008    Dr. Sherwood Gambler  . Fatty tumor removal    . Cardiac catheterization  05/2013    minimal coronary plaque with normal LV fxn (hochrein)  . Left heart catheterization with coronary angiogram N/A 06/13/2013    Procedure: LEFT HEART CATHETERIZATION WITH CORONARY ANGIOGRAM;  Surgeon: Minus Breeding, MD;  Location: [facility] CATH LAB;  Service: Cardiovascular;  Laterality: N/A;   Family History  Problem Relation Age of Onset  . Hypertension Mother   . Cancer Paternal Grandfather     lung  . CAD Maternal Grandmother     age 13 - CABG.  . Fibromyalgia      Sister and daughter  . Allergy (severe)     History  Substance Use Topics  . Smoking status: Former Smoker -- 30 years    Quit date: 01/24/2009  . Smokeless tobacco: Never Used     Comment: 1/2 ppd-2ppd - quit 2010.  Marland Kitchen Alcohol Use: 0.0 oz/week    0 Not specified per week     Comment: Rarel beer. 1-2/yr    Review of Systems  Constitutional: Positive for fever, chills, appetite change and fatigue.  HENT: Negative for congestion.   Eyes: Negative for visual disturbance.  Respiratory: Positive for cough and shortness of breath.   Cardiovascular: Positive for chest pain (worse with coughing bilateral anterior chest).  Gastrointestinal: Positive for nausea, vomiting and diarrhea. Negative for abdominal pain.  Genitourinary: Negative for dysuria and flank pain.  Musculoskeletal: Negative for back pain, neck pain  and neck stiffness.  Skin: Positive for rash.  Neurological: Positive for light-headedness. Negative for headaches.      Allergies  Review of patient's allergies indicates no known allergies.  Home Medications   Prior to Admission medications   Medication Sig Start Date End Date Taking? Authorizing Provider  cyclobenzaprine (FLEXERIL) 10 MG tablet Take 10 mg by mouth 2 (two) times daily as needed for muscle spasms.    Yes Historical Provider, MD  lisinopril (PRINIVIL,ZESTRIL) 20 MG  tablet TAKE 1 TABLET BY MOUTH DAILY. 08/10/13  Yes Lucille Passy, MD  metoprolol succinate (TOPROL-XL) 25 MG 24 hr tablet TAKE 1/2 TABLET BY MOUTH DAILY 06/27/14  Yes Lucille Passy, MD  Multiple Vitamin (MULTIVITAMIN) tablet Take 1 tablet by mouth daily.   Yes Historical Provider, MD  PARoxetine (PAXIL) 20 MG tablet TAKE 1 TABLET BY MOUTH ONCE A DAY 06/27/14  Yes Lucille Passy, MD  clotrimazole (LOTRIMIN) 1 % cream Apply 1 application topically 2 (two) times daily. Patient not taking: Reported on 08/06/2014 06/16/13   Ria Bush, MD  fluocinonide-emollient (LIDEX-E) 0.05 % cream Apply 1 application topically 2 (two) times daily. Patient not taking: Reported on 08/06/2014 01/04/14   Lucille Passy, MD  naproxen sodium (ANAPROX) 220 MG tablet Take 2 by mouth twice a day 3 to 5 days a week    Historical Provider, MD  oxyCODONE (OXY IR/ROXICODONE) 5 MG immediate release tablet 1-2 tabs at bedtime as needed for pain 08/06/14   Lucille Passy, MD  Pseudoephedrine-APAP-DM (DAYQUIL PO) Take 2 capsules by mouth as needed.     Historical Provider, MD   BP 116/63 mmHg  Pulse 100  Temp(Src) 98.6 F (37 C) (Oral)  Resp 19  SpO2 97% Physical Exam  Constitutional: He is oriented to person, place, and time. He appears well-developed and well-nourished.  HENT:  Head: Normocephalic and atraumatic.  Dry mucous membranes no meningismus  Eyes: Conjunctivae are normal. Right eye exhibits no discharge. Left eye exhibits no discharge.  Neck: Normal range of motion. Neck supple. No tracheal deviation present.  Cardiovascular: Regular rhythm.  Tachycardia present.   Pulmonary/Chest: Breath sounds normal. Respiratory distress: mild tachypnea.  Abdominal: Soft. He exhibits no distension. There is no tenderness ( morbid obesity). There is no guarding.  Musculoskeletal: He exhibits edema (mild lower extremity edema bilateral ankles.).  Neurological: He is alert and oriented to person, place, and time.  Skin: Skin is warm.  Rash (patient has mild erythema and warmth under bilateral axilla, no crepitus, no induration. No petechia or purpura seen.) noted.  Psychiatric: He has a normal mood and affect.  Nursing note and vitals reviewed.   ED Course  Procedures (including critical care time) CRITICAL CARE Performed by: Mariea Clonts   Total critical care time: 35 min  Critical care time was exclusive of separately billable procedures and treating other patients.  Critical care was necessary to treat or prevent imminent or life-threatening deterioration.  Critical care was time spent personally by me on the following activities: development of treatment plan with patient and/or surrogate as well as nursing, discussions with consultants, evaluation of patient's response to treatment, examination of patient, obtaining history from patient or surrogate, ordering and performing treatments and interventions, ordering and review of laboratory studies, ordering and review of radiographic studies, pulse oximetry and re-evaluation of patient's condition.  Labs Review Labs Reviewed  BASIC METABOLIC PANEL - Abnormal; Notable for the following:    Sodium 131 (*)  Glucose, Bld 114 (*)    All other components within normal limits  CBC - Abnormal; Notable for the following:    WBC 10.7 (*)    MCV 74.5 (*)    MCH 25.0 (*)    RDW 15.9 (*)    All other components within normal limits  URINALYSIS, ROUTINE W REFLEX MICROSCOPIC - Abnormal; Notable for the following:    Color, Urine AMBER (*)    APPearance CLOUDY (*)    Bilirubin Urine SMALL (*)    Ketones, ur 15 (*)    Leukocytes, UA SMALL (*)    All other components within normal limits  URINE MICROSCOPIC-ADD ON - Abnormal; Notable for the following:    Bacteria, UA FEW (*)    All other components within normal limits  I-STAT CHEM 8, ED - Abnormal; Notable for the following:    Sodium 129 (*)    Glucose, Bld 110 (*)    Calcium, Ion 1.02 (*)    All other  components within normal limits  CULTURE, BLOOD (ROUTINE X 2)  CULTURE, BLOOD (ROUTINE X 2)  BRAIN NATRIURETIC PEPTIDE  INFLUENZA PANEL BY PCR (TYPE A & B, H1N1)  I-STAT TROPOININ, ED  I-STAT CG4 LACTIC ACID, ED  I-STAT CG4 LACTIC ACID, ED    Imaging Review Dg Chest 2 View  08/06/2014   CLINICAL DATA:  Shortness of breath, chest pain with flu-like symptoms, fever  EXAM: CHEST  2 VIEW  COMPARISON:  06/12/2013  FINDINGS: The heart size and mediastinal contours are within normal limits. Both lungs are clear. The visualized skeletal structures are unremarkable.  IMPRESSION: No active cardiopulmonary disease.   Electronically Signed   By: Kathreen Devoid   On: 08/06/2014 11:00     EKG Interpretation None      MDM   Final diagnoses:  Chest pain  Cough  SIRS (systemic inflammatory response syndrome)  Fever, unspecified fever cause  Hyponatremia  Patient with worsening short of breath cough and chest pain since Friday with fevers. Clinically concern for sepsis/influenza, versus atypical cardiac, other. Plan for cardiac screen, sepsis workup with IV fluid boluses cultures, Rocephin initially and chest x-ray. Pt bp 88 systolic on arrival and tachy 140s with fever.  Patient aggressive septic evaluation and treatment in the ER. Heart rate normalized, blood pressure went from upper 31S systolic normal 970.  Patient improved clinically in the ER. Patient's blood work mild low-sodium no acute findings, chest x-ray reviewed no acute findings. No obvious source of his fever and symptoms except likely viral process. Discussed with triad hospitalist who agrees and recommends outpatient follow-up. Patient can follow-up blood cultures outpatient.  Results and differential diagnosis were discussed with the patient/parent/guardian. Close follow up outpatient was discussed, comfortable with the plan.   Medications  sodium chloride 0.9 % bolus 1,000 mL (0 mLs Intravenous Stopped 08/06/14 1232)  sodium  chloride 0.9 % bolus 1,000 mL (0 mLs Intravenous Stopped 08/06/14 1351)  cefTRIAXone (ROCEPHIN) 1 g in dextrose 5 % 50 mL IVPB (0 g Intravenous Stopped 08/06/14 1209)  0.9 %  sodium chloride infusion ( Intravenous New Bag/Given 08/06/14 1341)  acetaminophen (TYLENOL) tablet 650 mg (650 mg Oral Given 08/06/14 1159)  sodium chloride 0.9 % bolus 1,000 mL (0 mLs Intravenous Stopped 08/06/14 1504)    Filed Vitals:   08/06/14 1330 08/06/14 1345 08/06/14 1400 08/06/14 1415  BP: 120/60 115/63 106/57 116/63  Pulse: 92 96 93 100  Temp:      TempSrc:  Resp: 10 14 16 19   SpO2: 96% 95% 95% 97%    Final diagnoses:  Chest pain  Cough  SIRS (systemic inflammatory response syndrome)  Fever, unspecified fever cause        Mariea Clonts, MD 08/06/14 [phone]

## 2014-08-06 NOTE — Progress Notes (Signed)
Pre visit review using our clinic review tool, if applicable. No additional management support is needed unless otherwise documented below in the visit note. 

## 2014-08-06 NOTE — Assessment & Plan Note (Signed)
Deteriorated and now concern for ? Sepsis or renal insufficiency. >25 minutes spent in face to face time with patient, >50% spent in counselling or coordination of care. Advised work up- consisting of different round of abx- ? Doxy or clinda? Along with CMET, CBC to rule out renal failure, sepsis, etc.  Would be more prudent given his CP and diaphoresis as well to go to ER- needs cardiac work up, CXR, blood cultures, etc. [employer] [place] agrees to drive him to the hospital.  I spoke with triage nurse of [facility].

## 2014-08-06 NOTE — Progress Notes (Signed)
Subjective:   Patient ID: Richard Mcguire, male    DOB: Jul 06, 1971, 44 y.o.   MRN: [id number]  Richard Mcguire is a pleasant 44 y.o. year old male who presents to clinic today with his wife for multiple complaints including rash under arm and fever, chills  on 08/06/2014  HPI:  Rash under bilateral axilla- started after he used a new deodorant about a week ago.  Per pt, went to [facility] ED last week, given Bactrim and course of steroids.  Has not been able to finish course of either- vomiting with fever past few days.  Rash is very painful now, draining.  Also having intermittent CP and DOE. Neg cardiac cath in 06/2013- Dr. Percival Spanish.  Current Outpatient Prescriptions on File Prior to Visit  Medication Sig Dispense Refill  . cyclobenzaprine (FLEXERIL) 10 MG tablet Take 10 mg by mouth 2 (two) times daily as needed for muscle spasms.     Marland Kitchen lisinopril (PRINIVIL,ZESTRIL) 20 MG tablet TAKE 1 TABLET BY MOUTH DAILY. 30 tablet 11  . metoprolol succinate (TOPROL-XL) 25 MG 24 hr tablet TAKE 1/2 TABLET BY MOUTH DAILY 15 tablet 1  . Multiple Vitamin (MULTIVITAMIN) tablet Take 1 tablet by mouth daily.    . naproxen sodium (ANAPROX) 220 MG tablet Take 2 by mouth twice a day 3 to 5 days a week    . PARoxetine (PAXIL) 20 MG tablet TAKE 1 TABLET BY MOUTH ONCE A DAY 30 tablet 1  . Pseudoephedrine-APAP-DM (DAYQUIL PO) Take 2 capsules by mouth as needed.     . clotrimazole (LOTRIMIN) 1 % cream Apply 1 application topically 2 (two) times daily. (Patient not taking: Reported on 08/06/2014) 30 g 0  . fluocinonide-emollient (LIDEX-E) 0.05 % cream Apply 1 application topically 2 (two) times daily. (Patient not taking: Reported on 08/06/2014) 30 g 0   No current facility-administered medications on file prior to visit.    No Known Allergies  Past Medical History  Diagnosis Date  . Major depressive disorder, recurrent episode, moderate   . Morbid obesity   . Chronic pain   . Anxiety   . Lumbar disc  disease with radiculopathy     sees Dr. Rita Ohara  . HTN (hypertension)   . Tachycardia     a. 07/2012 - placed on BB. CT angio neg.  . Migraine headache   . Chronic fatigue   . Kidney stone   . Urethral stricture     Was told he would need pediatric cath if ever had urinary cath  . Dyslipidemia     Past Surgical History  Procedure Laterality Date  . Lumbar fusion  09/2008    Dr. Sherwood Gambler  . Fatty tumor removal    . Cardiac catheterization  05/2013    minimal coronary plaque with normal LV fxn (hochrein)  . Left heart catheterization with coronary angiogram N/A 06/13/2013    Procedure: LEFT HEART CATHETERIZATION WITH CORONARY ANGIOGRAM;  Surgeon: Minus Breeding, MD;  Location: [facility] CATH LAB;  Service: Cardiovascular;  Laterality: N/A;    Family History  Problem Relation Age of Onset  . Hypertension Mother   . Cancer Paternal Grandfather     lung  . CAD Maternal Grandmother     age 71 - CABG.  . Fibromyalgia      Sister and daughter  . Allergy (severe)      History   Social History  . Marital Status: Married    Spouse Name: N/A    Number of Children: 1  .  Years of Education: N/A   Occupational History  . disabled     former truck Geophysicist/field seismologist   Social History Main Topics  . Smoking status: Former Smoker -- 30 years    Quit date: 01/24/2009  . Smokeless tobacco: Never Used     Comment: 1/2 ppd-2ppd - quit 2010.  Marland Kitchen Alcohol Use: 0.0 oz/week    0 Not specified per week     Comment: Rarel beer. 1-2/yr  . Drug Use: No  . Sexual Activity: Not on file   Other Topics Concern  . Not on file   Social History Narrative   Lives with wife and daughter.  Daughter has multiple problems, including chronic fatigue syndrome.   Does not have living will.  Would desire CPR and life support if not futile or prolonged.   The PMH, PSH, Social History, Family History, Medications, and allergies have been reviewed in [facility], and have been updated if relevant.   Review of Systems    Constitutional: Positive for fever, chills, diaphoresis and fatigue.  Cardiovascular: Positive for chest pain. Negative for palpitations.  Gastrointestinal: Positive for nausea and vomiting. Negative for diarrhea.  Endocrine: Negative.   Genitourinary: Negative.   Musculoskeletal: Positive for arthralgias.  Skin: Positive for rash.  Neurological: Negative.   All other systems reviewed and are negative.      Objective:    BP 126/70 mmHg  Pulse 140  Temp(Src) 98.9 F (37.2 C) (Oral)  Wt 356 lb 8 oz (161.707 kg)   Physical Exam  Constitutional: He is oriented to person, place, and time.  HENT:  Head: Normocephalic.  Eyes: Conjunctivae are normal.  Neck: Normal range of motion.  Cardiovascular: Tachycardia present.   Pulmonary/Chest: No respiratory distress.  Neurological: He is alert and oriented to person, place, and time. No cranial nerve deficit.  Skin: Rash noted. He is diaphoretic.     Psychiatric: He has a normal mood and affect. His behavior is normal. Judgment and thought content normal.  Nursing note and vitals reviewed.         Assessment & Plan:   Chest pain, unspecified chest pain type  Rash No Follow-up on file.

## 2014-08-06 NOTE — Consult Note (Signed)
Requesting physician: Dr. Reather Converse, EDP  Primary Care Physician: Arnette Norris, MD  Reason for consultation: Potential admission  History of Present Illness: 44 y/o man with morbid obesity, HTN who presents to the hospital with a 5 day h/o CP,SOB and cough. His wife has been sick with a URI. CP is worse with exertion. Cough is productive of clear sputum. Had a temp of 101.3 on admission and sinus tach into the 140s. He admits to decreased PO intake and vomiting following bactrim given to him at [place] 1 week ago for what was thought to be a cellulitis under the axilla (looks more like a contact dermatitis to me). He received 3 L of IVF, with resolution of tachycardia. BP normal, not hypoxic. Had a normal cardiac cath in December 2014. Work up in the ED is notable for a negative CXR, negative troponin, EKG without acute ischemic changes, mild hyponatremia to 129-131 and mild leukocytosis of 10.7. Flu PCR negative. EDP believes patient can safely be discharged home but spouse has concerns and hence I have been asked to see him for further evaluation and management.  Allergies:  No Known Allergies    Past Medical History  Diagnosis Date  . Major depressive disorder, recurrent episode, moderate   . Morbid obesity   . Chronic pain   . Anxiety   . Lumbar disc disease with radiculopathy     sees Dr. Rita Ohara  . HTN (hypertension)   . Tachycardia     a. 07/2012 - placed on BB. CT angio neg.  . Migraine headache   . Chronic fatigue   . Kidney stone   . Urethral stricture     Was told he would need pediatric cath if ever had urinary cath  . Dyslipidemia     Past Surgical History  Procedure Laterality Date  . Lumbar fusion  09/2008    Dr. Sherwood Gambler  . Fatty tumor removal    . Cardiac catheterization  05/2013    minimal coronary plaque with normal LV fxn (hochrein)  . Left heart catheterization with coronary angiogram N/A 06/13/2013    Procedure: LEFT HEART CATHETERIZATION WITH CORONARY  ANGIOGRAM;  Surgeon: Minus Breeding, MD;  Location: [facility] CATH LAB;  Service: Cardiovascular;  Laterality: N/A;    Scheduled Meds: Continuous Infusions: PRN Meds:.  Social History:  reports that he quit smoking about 5 years ago. He has never used smokeless tobacco. He reports that he drinks alcohol. He reports that he does not use illicit drugs.  Family History  Problem Relation Age of Onset  . Hypertension Mother   . Cancer Paternal Grandfather     lung  . CAD Maternal Grandmother     age 73 - CABG.  . Fibromyalgia      Sister and daughter  . Allergy (severe)      Review of Systems:  Constitutional: Positive for fever, chills, diaphoresis, appetite change and fatigue.  HEENT: Denies photophobia, eye pain, redness, hearing loss, ear pain, congestion, sore throat, rhinorrhea, sneezing, mouth sores, trouble swallowing, neck pain, neck stiffness and tinnitus.   Respiratory: Denies SOB, DOE, cough, chest tightness,  and wheezing.   Cardiovascular: Denies  palpitations and leg swelling.  Gastrointestinal: Denies  abdominal pain, diarrhea, constipation, blood in stool and abdominal distention.  Genitourinary: Denies dysuria, urgency, frequency, hematuria, flank pain and difficulty urinating.  Endocrine: Denies: hot or cold intolerance, sweats, changes in hair or nails, polyuria, polydipsia. Musculoskeletal: Denies myalgias, back pain, joint swelling, arthralgias and gait problem.  Skin: Denies  pallor, rash and wound.  Neurological: Denies dizziness, seizures, syncope, weakness, light-headedness, numbness and headaches.  Hematological: Denies adenopathy. Easy bruising, personal or family bleeding history  Psychiatric/Behavioral: Denies suicidal ideation, mood changes, confusion, nervousness, sleep disturbance and agitation   Physical Exam: Blood pressure 116/63, pulse 100, temperature 98.6 F (37 C), temperature source Oral, resp. rate 19, SpO2 97 %. Gen: Obese, AA Ox3, NAD HEENT:  [place]/AT/PERRL/EOMI/dry mucous membranes, pharyngeal erythema. Neck: supple, no JVD, no LAD, no bruits, no goiter CV: RRR, no M/R/G Lungs: CTA B Abd: obese/S/NT/ND/+BS/no masses or organomegaly noted. Ext: no C/C/E/+pulses Neuro: grossly intact and non-foca. I have not ambulated him.  Labs on Admission:  Results for orders placed or performed during the hospital encounter of 08/06/14 (from the past 48 hour(s))  BNP (order ONLY if patient complains of dyspnea/SOB AND you have documented it for THIS visit)     Status: None   Collection Time: 08/06/14 10:32 AM  Result Value Ref Range   B Natriuretic Peptide 9.4 0.0 - 100.0 pg/mL  Basic metabolic panel     Status: Abnormal   Collection Time: 08/06/14 10:32 AM  Result Value Ref Range   Sodium 131 (L) 135 - 145 mmol/L   Potassium 4.0 3.5 - 5.1 mmol/L   Chloride 98 96 - 112 mmol/L   CO2 23 19 - 32 mmol/L   Glucose, Bld 114 (H) 70 - 99 mg/dL   BUN 7 6 - 23 mg/dL   Creatinine, Ser 0.95 0.50 - 1.35 mg/dL   Calcium 8.6 8.4 - 10.5 mg/dL   GFR calc non Af Amer >90 >90 mL/min   GFR calc Af Amer >90 >90 mL/min    Comment: (NOTE) The eGFR has been calculated using the CKD EPI equation. This calculation has not been validated in all clinical situations. eGFR's persistently <90 mL/min signify possible Chronic Kidney Disease.    Anion gap 10 5 - 15  CBC     Status: Abnormal   Collection Time: 08/06/14 10:32 AM  Result Value Ref Range   WBC 10.7 (H) 4.0 - 10.5 K/uL   RBC 5.52 4.22 - 5.81 MIL/uL   Hemoglobin 13.8 13.0 - 17.0 g/dL   HCT 41.1 39.0 - 52.0 %   MCV 74.5 (L) 78.0 - 100.0 fL   MCH 25.0 (L) 26.0 - 34.0 pg   MCHC 33.6 30.0 - 36.0 g/dL   RDW 15.9 (H) 11.5 - 15.5 %   Platelets 196 150 - 400 K/uL  Urinalysis, Routine w reflex microscopic     Status: Abnormal   Collection Time: 08/06/14 11:21 AM  Result Value Ref Range   Color, Urine AMBER (A) YELLOW    Comment: BIOCHEMICALS MAY BE AFFECTED BY COLOR   APPearance CLOUDY (A) CLEAR    Specific Gravity, Urine 1.024 1.005 - 1.030   pH 5.0 5.0 - 8.0   Glucose, UA NEGATIVE NEGATIVE mg/dL   Hgb urine dipstick NEGATIVE NEGATIVE   Bilirubin Urine SMALL (A) NEGATIVE   Ketones, ur 15 (A) NEGATIVE mg/dL   Protein, ur NEGATIVE NEGATIVE mg/dL   Urobilinogen, UA 0.2 0.0 - 1.0 mg/dL   Nitrite NEGATIVE NEGATIVE   Leukocytes, UA SMALL (A) NEGATIVE  Urine microscopic-add on     Status: Abnormal   Collection Time: 08/06/14 11:21 AM  Result Value Ref Range   Squamous Epithelial / LPF RARE RARE   WBC, UA 7-10 <3 WBC/hpf   Bacteria, UA FEW (A) RARE   Urine-Other AMORPHOUS URATES/PHOSPHATES   Influenza panel by  PCR (type A & B, H1N1)     Status: None   Collection Time: 08/06/14 11:42 AM  Result Value Ref Range   Influenza A By PCR NEGATIVE NEGATIVE   Influenza B By PCR NEGATIVE NEGATIVE   H1N1 flu by pcr NOT DETECTED NOT DETECTED    Comment:        The Xpert Flu assay (FDA approved for nasal aspirates or washes and nasopharyngeal swab specimens), is intended as an aid in the diagnosis of influenza and should not be used as a sole basis for treatment.   I-stat chem 8, ed     Status: Abnormal   Collection Time: 08/06/14 11:49 AM  Result Value Ref Range   Sodium 129 (L) 135 - 145 mmol/L   Potassium 4.2 3.5 - 5.1 mmol/L   Chloride 98 96 - 112 mmol/L   BUN 9 6 - 23 mg/dL   Creatinine, Ser 0.90 0.50 - 1.35 mg/dL   Glucose, Bld 110 (H) 70 - 99 mg/dL   Calcium, Ion 1.02 (L) 1.12 - 1.23 mmol/L   TCO2 21 0 - 100 mmol/L   Hemoglobin 15.0 13.0 - 17.0 g/dL   HCT 44.0 39.0 - 52.0 %  I-stat troponin, ED (not at [facility])     Status: None   Collection Time: 08/06/14 12:10 PM  Result Value Ref Range   Troponin i, poc 0.00 0.00 - 0.08 ng/mL   Comment 3            Comment: Due to the release kinetics of cTnI, a negative result within the first hours of the onset of symptoms does not rule out myocardial infarction with certainty. If myocardial infarction is still suspected, repeat the test  at appropriate intervals.     Radiological Exams on Admission: Dg Chest 2 View  08/06/2014   CLINICAL DATA:  Shortness of breath, chest pain with flu-like symptoms, fever  EXAM: CHEST  2 VIEW  COMPARISON:  06/12/2013  FINDINGS: The heart size and mediastinal contours are within normal limits. Both lungs are clear. The visualized skeletal structures are unremarkable.  IMPRESSION: No active cardiopulmonary disease.   Electronically Signed   By: Kathreen Devoid   On: 08/06/2014 11:00    Assessment/Plan   Chest Pain/SOB -With fever, negative CXR, normal cardiac cath <1 year ago and reassuring EKG/troponin, believe this most likely represents a non-influenza viral URI. -Influenza PCR negative. -I do not see any indication for antibiotics at present. -I also do not see any benefit from admission to the hospital at present. -He was almost certainly dehydrated on admission given clinical appearance, tachycardia and mild hyponatremia. Has received 3.5 L of IVF and tachycardia has resolved. -See no indication to perform a CT angio of his chest given lack of LE edema, not hypoxic and the fact that tachycardia resolved with IVF. -Advised EDP he may safely be discharged home. -Encouraged patient to rest, take copious IVF, and undertake symptomatic management for his URI and to follow up with PCP if symptoms recur.  Thank you for this consult.  Time Spent on Consultation: 85 minutes  [name]  [phone] 08/06/2014, 4:21 PM

## 2014-08-07 LAB — I-STAT CG4 LACTIC ACID, ED
LACTIC ACID, VENOUS: 2.4 mmol/L — AB (ref 0.5–2.0)
Lactic Acid, Venous: 0.54 mmol/L (ref 0.5–2.0)

## 2014-08-12 LAB — CULTURE, BLOOD (ROUTINE X 2)
Culture: NO GROWTH
Culture: NO GROWTH

## 2014-08-20 ENCOUNTER — Ambulatory Visit: Payer: Medicare Other | Admitting: Family Medicine

## 2014-08-21 ENCOUNTER — Encounter: Payer: Self-pay | Admitting: Family Medicine

## 2014-08-21 ENCOUNTER — Ambulatory Visit (INDEPENDENT_AMBULATORY_CARE_PROVIDER_SITE_OTHER): Payer: Medicare Other | Admitting: Family Medicine

## 2014-08-21 VITALS — BP 152/88 | HR 98 | Temp 98.8°F | Wt 360.2 lb

## 2014-08-21 DIAGNOSIS — R21 Rash and other nonspecific skin eruption: Secondary | ICD-10-CM

## 2014-08-21 DIAGNOSIS — Z23 Encounter for immunization: Secondary | ICD-10-CM

## 2014-08-21 DIAGNOSIS — R6510 Systemic inflammatory response syndrome (SIRS) of non-infectious origin without acute organ dysfunction: Secondary | ICD-10-CM

## 2014-08-21 DIAGNOSIS — E871 Hypo-osmolality and hyponatremia: Secondary | ICD-10-CM | POA: Insufficient documentation

## 2014-08-21 DIAGNOSIS — A419 Sepsis, unspecified organism: Secondary | ICD-10-CM

## 2014-08-21 DIAGNOSIS — R079 Chest pain, unspecified: Secondary | ICD-10-CM

## 2014-08-21 LAB — BASIC METABOLIC PANEL
BUN: 12 mg/dL (ref 6–23)
CALCIUM: 9 mg/dL (ref 8.4–10.5)
CO2: 29 mEq/L (ref 19–32)
Chloride: 103 mEq/L (ref 96–112)
Creatinine, Ser: 0.99 mg/dL (ref 0.40–1.50)
GFR: 87.74 mL/min (ref >60.00)
GLUCOSE: 113 mg/dL — AB (ref 70–99)
Potassium: 4.4 mEq/L (ref 3.5–5.1)
SODIUM: 136 meq/L (ref 135–145)

## 2014-08-21 MED ORDER — NYSTATIN 100000 UNIT/GM EX POWD: CUTANEOUS | Status: DC

## 2014-08-21 MED ORDER — NYSTATIN 100000 UNIT/GM EX CREA: 1.0000 "application " | TOPICAL_CREAM | Freq: Two times a day (BID) | CUTANEOUS | Status: DC

## 2014-08-21 NOTE — Patient Instructions (Addendum)
Good to see you. Please apply nystatin twice daily (cream), may use powder on top of the cream to help dry out the area if it helps. We will call you with your lab results.

## 2014-08-21 NOTE — Progress Notes (Signed)
Pre visit review using our clinic review tool, if applicable. No additional management support is needed unless otherwise documented below in the visit note. 

## 2014-08-21 NOTE — Assessment & Plan Note (Signed)
Persistent. ? If fungal at this point.  Does look like a contact dermatitis but at this point, there would be nothing he is reacting too--no deodorant for weeks. Will prescribe nystatin cream, ok to use nystatin powder on top of it since it is an area that holds moisture naturally. If no improvement in 1 weeks, refer to dermatology. The patient indicates understanding of these issues and agrees with the plan.

## 2014-08-21 NOTE — Assessment & Plan Note (Signed)
Repeat BMET today.  

## 2014-08-21 NOTE — Assessment & Plan Note (Signed)
Resolved

## 2014-08-21 NOTE — Progress Notes (Signed)
Subjective:   Patient ID: Richard Mcguire, male    DOB: 1970/11/20, 44 y.o.   MRN: [id number]  Richard Mcguire is a pleasant 44 y.o. year old male who presents to clinic today with Hospitalization Follow-up and Rash  on 08/21/2014  HPI:  Notes from [facility] 1/25 reviewed.  On 1/25, I saw him in my office for several complaints including rash, chest pain, vomiting, fever.  He looked quite ill when I saw him and I felt he needed to be seen in ER for possible fluids, sepsis work up and possible hospital admission.  In ER: Neg flu swab Neg CXR Neg troponins EKG- unchanged WBC 10.7 Na 129  Was given 3 L of IVs and sent home.  Of note, the previous week, was seen at [facility] and placed on Bactrim for rash under his axilla bilaterally.  Today, he feels much better.  No fevers, no more vomiting.  Less fatigued and no longer diaphoretic. SOB has also improved. Unfortunately, however, the rash has not improved at all. Oral bactrim did not help.  He is not using any deodorant or new products.  Current Outpatient Prescriptions on File Prior to Visit  Medication Sig Dispense Refill  . clotrimazole (LOTRIMIN) 1 % cream Apply 1 application topically 2 (two) times daily. 30 g 0  . cyclobenzaprine (FLEXERIL) 10 MG tablet Take 10 mg by mouth 2 (two) times daily as needed for muscle spasms.     . fluocinonide-emollient (LIDEX-E) 0.05 % cream Apply 1 application topically 2 (two) times daily. 30 g 0  . lisinopril (PRINIVIL,ZESTRIL) 20 MG tablet TAKE 1 TABLET BY MOUTH DAILY. 30 tablet 11  . metoprolol succinate (TOPROL-XL) 25 MG 24 hr tablet TAKE 1/2 TABLET BY MOUTH DAILY 15 tablet 1  . Multiple Vitamin (MULTIVITAMIN) tablet Take 1 tablet by mouth daily.    . naproxen sodium (ANAPROX) 220 MG tablet Take 2 by mouth twice a day 3 to 5 days a week    . oxyCODONE (OXY IR/ROXICODONE) 5 MG immediate release tablet 1-2 tabs at bedtime as needed for pain 60 tablet 0  . PARoxetine (PAXIL) 20 MG tablet  TAKE 1 TABLET BY MOUTH ONCE A DAY 30 tablet 1  . Pseudoephedrine-APAP-DM (DAYQUIL PO) Take 2 capsules by mouth as needed.      No current facility-administered medications on file prior to visit.    No Known Allergies  Past Medical History  Diagnosis Date  . Major depressive disorder, recurrent episode, moderate   . Morbid obesity   . Chronic pain   . Anxiety   . Lumbar disc disease with radiculopathy     sees Dr. Rita Ohara  . HTN (hypertension)   . Tachycardia     a. 07/2012 - placed on BB. CT angio neg.  . Migraine headache   . Chronic fatigue   . Kidney stone   . Urethral stricture     Was told he would need pediatric cath if ever had urinary cath  . Dyslipidemia     Past Surgical History  Procedure Laterality Date  . Lumbar fusion  09/2008    Dr. Sherwood Gambler  . Fatty tumor removal    . Cardiac catheterization  05/2013    minimal coronary plaque with normal LV fxn (hochrein)  . Left heart catheterization with coronary angiogram N/A 06/13/2013    Procedure: LEFT HEART CATHETERIZATION WITH CORONARY ANGIOGRAM;  Surgeon: Minus Breeding, MD;  Location: [facility] CATH LAB;  Service: Cardiovascular;  Laterality: N/A;  Family History  Problem Relation Age of Onset  . Hypertension Mother   . Cancer Paternal Grandfather     lung  . CAD Maternal Grandmother     age 92 - CABG.  . Fibromyalgia      Sister and daughter  . Allergy (severe)      History   Social History  . Marital Status: Married    Spouse Name: N/A    Number of Children: 1  . Years of Education: N/A   Occupational History  . disabled     former truck Geophysicist/field seismologist   Social History Main Topics  . Smoking status: Former Smoker -- 30 years    Quit date: 01/24/2009  . Smokeless tobacco: Never Used     Comment: 1/2 ppd-2ppd - quit 2010.  Marland Kitchen Alcohol Use: 0.0 oz/week    0 Not specified per week     Comment: Rarel beer. 1-2/yr  . Drug Use: No  . Sexual Activity: Not on file   Other Topics Concern  . Not on file    Social History Narrative   Lives with wife and daughter.  Daughter has multiple problems, including chronic fatigue syndrome.   Does not have living will.  Would desire CPR and life support if not futile or prolonged.   The PMH, PSH, Social History, Family History, Medications, and allergies have been reviewed in [facility], and have been updated if relevant.    Review of Systems  Constitutional: Negative.   HENT: Negative.   Eyes: Negative.   Respiratory: Negative.   Cardiovascular: Negative.   Gastrointestinal: Negative.   Endocrine: Negative.   Genitourinary: Negative.   Musculoskeletal: Negative.   Skin: Positive for rash.  Allergic/Immunologic: Negative.   Neurological: Negative.   Hematological: Negative.   Psychiatric/Behavioral: Negative.        Objective:    BP 152/88 mmHg  Pulse 98  Temp(Src) 98.8 F (37.1 C) (Oral)  Wt 360 lb 4 oz (163.408 kg)  SpO2 95%   Physical Exam  Constitutional: He is oriented to person, place, and time. He appears well-developed and well-nourished. No distress.  Morbidly obese, non toxic appearing today   HENT:  Head: Normocephalic.  Eyes: Conjunctivae are normal.  Neck: Normal range of motion.  Cardiovascular: Normal heart sounds.   Pulmonary/Chest: Effort normal.  Musculoskeletal: Normal range of motion.  Neurological: He is alert and oriented to person, place, and time. No cranial nerve deficit.  Skin: Rash noted. He is not diaphoretic.  Confluent, raised, erythematous plaques on bilateral axilla, no drainage, raised around borders, no drainage or papules  Psychiatric: He has a normal mood and affect. His behavior is normal. Judgment and thought content normal.  Nursing note and vitals reviewed.         Assessment & Plan:   Rash  SIRS (systemic inflammatory response syndrome)  Hyponatremia - Plan: Basic Metabolic Panel No Follow-up on file.

## 2014-08-22 ENCOUNTER — Encounter: Payer: Self-pay | Admitting: *Deleted

## 2014-08-22 NOTE — Addendum Note (Signed)
Addended by: Modena Nunnery on: 08/22/2014 10:46 AM   Modules accepted: Orders

## 2014-08-27 ENCOUNTER — Other Ambulatory Visit: Payer: Self-pay | Admitting: Family Medicine

## 2014-09-06 ENCOUNTER — Other Ambulatory Visit: Payer: Self-pay

## 2014-09-06 MED ORDER — OXYCODONE HCL 5 MG PO TABS: ORAL_TABLET | ORAL | Status: DC

## 2014-09-06 NOTE — Telephone Encounter (Signed)
Pt left v/m requesting rx oxycodone. Call when ready for pick up . Pt last seen 08/21/2014 for hospital f/u and rx last printed 08/06/2014.

## 2014-09-07 MED ORDER — OXYCODONE HCL 5 MG PO TABS: ORAL_TABLET | ORAL | Status: DC

## 2014-09-07 NOTE — Telephone Encounter (Signed)
Spoke to pt and informed him Rx is available for pickup from the front desk; advised third party unable to pickup

## 2014-09-07 NOTE — Addendum Note (Signed)
Addended by: Modena Nunnery on: 09/07/2014 11:45 AM   Modules accepted: Orders

## 2014-09-07 NOTE — Telephone Encounter (Signed)
Yes

## 2014-09-07 NOTE — Telephone Encounter (Signed)
Dr Deborra Medina approved these, but they were not printed and signed. Do you mind signing off on these for me, please?

## 2014-09-16 ENCOUNTER — Other Ambulatory Visit: Payer: Self-pay | Admitting: Family Medicine

## 2014-09-19 ENCOUNTER — Other Ambulatory Visit: Payer: Self-pay | Admitting: Family Medicine

## 2014-10-24 ENCOUNTER — Other Ambulatory Visit: Payer: Self-pay

## 2014-10-24 MED ORDER — OXYCODONE HCL 5 MG PO TABS: ORAL_TABLET | ORAL | Status: DC

## 2014-10-24 NOTE — Telephone Encounter (Signed)
Pt left v/m requesting rx oxycodone. Call when ready for pick up. Last printed 09/07/14 and pt last hospital f/u was 08/21/14.Please advise.

## 2014-10-24 NOTE — Telephone Encounter (Signed)
Spoke to pt and informed him Rx is available for pickup. Pt advised third party unable to pickup

## 2014-10-29 ENCOUNTER — Encounter: Payer: Self-pay | Admitting: Family Medicine

## 2014-11-17 ENCOUNTER — Other Ambulatory Visit: Payer: Self-pay | Admitting: Family Medicine

## 2014-11-20 ENCOUNTER — Other Ambulatory Visit: Payer: Self-pay | Admitting: Family Medicine

## 2014-11-26 ENCOUNTER — Encounter: Payer: Self-pay | Admitting: Family Medicine

## 2014-12-05 ENCOUNTER — Other Ambulatory Visit: Payer: Self-pay

## 2014-12-05 NOTE — Telephone Encounter (Signed)
Ok to print out and put in my box for signature. 

## 2014-12-05 NOTE — Telephone Encounter (Signed)
Pt left v/m requesting rx oxycodone. Call when ready for pick up; hospital f/u appt on 08/21/14 and rx last printed # 60 on 10/24/14.

## 2014-12-06 MED ORDER — OXYCODONE HCL 5 MG PO TABS: ORAL_TABLET | ORAL | Status: DC

## 2014-12-07 NOTE — Telephone Encounter (Signed)
Spoke to pt and informed him Rx is available for pickup from the front desk 

## 2015-01-09 ENCOUNTER — Other Ambulatory Visit: Payer: Self-pay

## 2015-01-09 MED ORDER — OXYCODONE HCL 5 MG PO TABS: ORAL_TABLET | ORAL | Status: DC

## 2015-01-09 NOTE — Telephone Encounter (Signed)
Spoke to pt and informed him Rx is available for pickup from the front desk 

## 2015-01-09 NOTE — Telephone Encounter (Signed)
Pt left v/m requesting rx oxycodone. Call when ready for pick up. Last seen hospital f/u 08/21/14 and rx last printed # 60 on 12/06/14.

## 2015-02-13 ENCOUNTER — Other Ambulatory Visit: Payer: Self-pay

## 2015-02-13 MED ORDER — OXYCODONE HCL 5 MG PO TABS: ORAL_TABLET | ORAL | Status: DC

## 2015-02-13 NOTE — Telephone Encounter (Signed)
Lm on pts vm and informed him Rx is available for pickup from the front desk 

## 2015-02-13 NOTE — Telephone Encounter (Signed)
Pt left v/m requesting rx oxycodone. Call when ready for pick up. rx last printed # 60 on 01/09/15. Last f/u appt on 08/21/14.

## 2015-03-12 ENCOUNTER — Other Ambulatory Visit: Payer: Self-pay | Admitting: *Deleted

## 2015-03-12 MED ORDER — PAROXETINE HCL 20 MG PO TABS: 20.0000 mg | ORAL_TABLET | Freq: Every day | ORAL | Status: DC

## 2015-03-12 MED ORDER — LISINOPRIL 20 MG PO TABS: 20.0000 mg | ORAL_TABLET | Freq: Every day | ORAL | Status: DC

## 2015-03-14 ENCOUNTER — Other Ambulatory Visit: Payer: Self-pay

## 2015-03-14 MED ORDER — OXYCODONE HCL 5 MG PO TABS: ORAL_TABLET | ORAL | Status: DC

## 2015-03-14 NOTE — Telephone Encounter (Signed)
Spoke to pt and informed him Rx is available for pickup from the front desk 

## 2015-03-14 NOTE — Telephone Encounter (Signed)
Pt left v/m requesting rx oxycodone. Call when ready for pick up. rx last printed # 60 on 02/13/15. Last f/u exam on 08/21/14.

## 2015-04-03 ENCOUNTER — Other Ambulatory Visit: Payer: Self-pay | Admitting: Family Medicine

## 2015-04-25 ENCOUNTER — Encounter: Payer: Self-pay | Admitting: Radiology

## 2015-04-25 ENCOUNTER — Encounter: Payer: Self-pay | Admitting: Family Medicine

## 2015-04-25 ENCOUNTER — Ambulatory Visit (INDEPENDENT_AMBULATORY_CARE_PROVIDER_SITE_OTHER): Payer: Medicare Other | Admitting: Family Medicine

## 2015-04-25 VITALS — BP 138/74 | HR 86 | Temp 98.1°F | Ht 73.0 in | Wt 364.8 lb

## 2015-04-25 DIAGNOSIS — I10 Essential (primary) hypertension: Secondary | ICD-10-CM

## 2015-04-25 DIAGNOSIS — Z0000 Encounter for general adult medical examination without abnormal findings: Secondary | ICD-10-CM | POA: Diagnosis not present

## 2015-04-25 DIAGNOSIS — Z23 Encounter for immunization: Secondary | ICD-10-CM

## 2015-04-25 DIAGNOSIS — F32A Depression, unspecified: Secondary | ICD-10-CM

## 2015-04-25 DIAGNOSIS — G894 Chronic pain syndrome: Secondary | ICD-10-CM | POA: Diagnosis not present

## 2015-04-25 DIAGNOSIS — G4733 Obstructive sleep apnea (adult) (pediatric): Secondary | ICD-10-CM | POA: Diagnosis not present

## 2015-04-25 DIAGNOSIS — F329 Major depressive disorder, single episode, unspecified: Secondary | ICD-10-CM

## 2015-04-25 LAB — CBC WITH DIFFERENTIAL/PLATELET
Basophils Absolute: 0 10*3/uL (ref 0.0–0.1)
Basophils Relative: 0.4 % (ref 0.0–3.0)
EOS ABS: 0.1 10*3/uL (ref 0.0–0.7)
EOS PCT: 1.2 % (ref 0.0–5.0)
HCT: 43.5 % (ref 39.0–52.0)
HEMOGLOBIN: 14.1 g/dL (ref 13.0–17.0)
Lymphocytes Relative: 26.1 % (ref 12.0–46.0)
Lymphs Abs: 3 10*3/uL (ref 0.7–4.0)
MCHC: 32.5 g/dL (ref 30.0–36.0)
MCV: 78.4 fl (ref 78.0–100.0)
Monocytes Absolute: 0.6 10*3/uL (ref 0.1–1.0)
Monocytes Relative: 5.1 % (ref 3.0–12.0)
Neutro Abs: 7.7 10*3/uL (ref 1.4–7.7)
Neutrophils Relative %: 67.2 % (ref 43.0–77.0)
Platelets: 270 10*3/uL (ref 150.0–400.0)
RBC: 5.55 Mil/uL (ref 4.22–5.81)
RDW: 15.6 % — ABNORMAL HIGH (ref 11.5–15.5)
WBC: 11.5 10*3/uL — AB (ref 4.0–10.5)

## 2015-04-25 LAB — COMPREHENSIVE METABOLIC PANEL
ALBUMIN: 4.1 g/dL (ref 3.5–5.2)
ALT: 20 U/L (ref 0–53)
AST: 15 U/L (ref 0–37)
Alkaline Phosphatase: 123 U/L — ABNORMAL HIGH (ref 39–117)
BILIRUBIN TOTAL: 0.6 mg/dL (ref 0.2–1.2)
BUN: 14 mg/dL (ref 6–23)
CO2: 31 mEq/L (ref 19–32)
CREATININE: 0.92 mg/dL (ref 0.40–1.50)
Calcium: 9.4 mg/dL (ref 8.4–10.5)
Chloride: 100 mEq/L (ref 96–112)
GFR: 95.19 mL/min (ref >60.00)
GLUCOSE: 80 mg/dL (ref 70–99)
Potassium: 4.7 mEq/L (ref 3.5–5.1)
SODIUM: 138 meq/L (ref 135–145)
Total Protein: 7 g/dL (ref 6.0–8.3)

## 2015-04-25 LAB — LIPID PANEL
CHOLESTEROL: 208 mg/dL — AB (ref 0–200)
HDL: 42.4 mg/dL (ref >39.00)
LDL Cholesterol: 137 mg/dL — ABNORMAL HIGH (ref 0–99)
NONHDL: 165.61
Total CHOL/HDL Ratio: 5
Triglycerides: 141 mg/dL (ref 0.0–149.0)
VLDL: 28.2 mg/dL (ref 0.0–40.0)

## 2015-04-25 MED ORDER — OXYCODONE HCL 5 MG PO TABS: ORAL_TABLET | ORAL | Status: DC

## 2015-04-25 NOTE — Assessment & Plan Note (Signed)
Refusing treatment.

## 2015-04-25 NOTE — Progress Notes (Signed)
Pre visit review using our clinic review tool, if applicable. No additional management support is needed unless otherwise documented below in the visit note. 

## 2015-04-25 NOTE — Assessment & Plan Note (Signed)
The patients weight, height, BMI and visual acuity have been recorded in the chart.  Cognitive function assessed.   I have made referrals, counseling and provided education to the patient based review of the above and I have provided the pt with a written personalized care plan for preventive services.  Influenza vaccine given today. 

## 2015-04-25 NOTE — Assessment & Plan Note (Signed)
Well controlled on current rx. No changes to rxs today.

## 2015-04-25 NOTE — Progress Notes (Signed)
44 yo pleasant male here for medicare wellness visit and follow up of chronic medical conditions.  I have personally reviewed the Medicare Annual Wellness questionnaire and have noted 1. The patient's medical and social history 2. Their use of alcohol, tobacco or illicit drugs 3. Their current medications and supplements 4. The patient's functional ability including ADL's, fall risks, home safety risks and hearing or visual             impairment. 5. Diet and physical activities 6. Evidence for depression or mood disorders  End of life wishes discussed and updated in Social History.  The roster of all physicians providing medical care to patient - is listed in the Snapshot section of the chart.  Tdap 06/16/13  OSA-  Saw Dr. Gwenette Greet in July 2015 for OSA- note reviewed.  Has not followed up and refuses to wear CPAP because it "smothers him."  HTN- BP improved today- compliant with toprol and lisinopril.  Denies HA or blurred vision. BP Readings from Last 3 Encounters:  04/25/15 138/74  08/21/14 152/88  08/06/14 133/79    Pain has improved -h/o L5 fusion (per pt, "didn't take.")- s/p epidural injection i n9/2016. States he is in pain constantly.  Takes flexeril and tramadol and feels they take the edge off but nothing takes away the pain.  States that Dr. Rita Ohara has stated he is not a candidate for further surgeries.    Started Oxycodone 5 mg every 4 hours as needed- taking 2 tablets at bedtime and that helps him sleep.  Still needing this.  Tobacco abuse- quit smoking in 3/10 before his spinal surgery and has not restarted.   Morbidly obese-  States he cannot exercise due to his back pain and his pain worsens in cold weather.  Has thought about gastric surgery but states he know that he cannot stick to lifestyle changes- he does not like to eat vegetables. Wt Readings from Last 3 Encounters:  04/25/15 364 lb 12 oz (165.45 kg)  08/21/14 360 lb 4 oz (163.408 kg)  08/06/14 356 lb 8 oz  (161.707 kg)    Lab Results  Component Value Date   CHOL 207* 01/04/2014   HDL 36.80* 01/04/2014   LDLCALC 140* 01/04/2014   LDLDIRECT 134* 07/15/2012   TRIG 153.0* 01/04/2014   CHOLHDL 6 01/04/2014   Lab Results  Component Value Date   CREATININE 0.99 08/21/2014   Lab Results  Component Value Date   NA 136 08/21/2014   K 4.4 08/21/2014   CL 103 08/21/2014   CO2 29 08/21/2014      Patient Active Problem List   Diagnosis Date Noted  . Medicare annual wellness visit, subsequent 04/25/2015  . History of lumbar fusion 02/07/2014  . Chronic pain syndrome 01/04/2014  . OSA (obstructive sleep apnea) 06/16/2013  . Former tobacco use 06/12/2013  . Chronic fatigue 06/12/2013  . HTN (hypertension) 07/15/2012  . Morbid obesity ([place]) 11/20/2009  . MAJOR DPRSV DISORDER RECURRENT EPISODE MODERATE 03/28/2009   Past Medical History  Diagnosis Date  . Major depressive disorder, recurrent episode, moderate ([place])   . Morbid obesity ([place])   . Chronic pain   . Anxiety   . Lumbar disc disease with radiculopathy     sees Dr. Rita Ohara  . HTN (hypertension)   . Tachycardia     a. 07/2012 - placed on BB. CT angio neg.  . Migraine headache   . Chronic fatigue   . Kidney stone   . Urethral stricture  Was told he would need pediatric cath if ever had urinary cath  . Dyslipidemia    Past Surgical History  Procedure Laterality Date  . Lumbar fusion  09/2008    Dr. Sherwood Gambler  . Fatty tumor removal    . Cardiac catheterization  05/2013    minimal coronary plaque with normal LV fxn (hochrein)  . Left heart catheterization with coronary angiogram N/A 06/13/2013    Procedure: LEFT HEART CATHETERIZATION WITH CORONARY ANGIOGRAM;  Surgeon: Minus Breeding, MD;  Location: [facility] CATH LAB;  Service: Cardiovascular;  Laterality: N/A;   Social History  Substance Use Topics  . Smoking status: Former Smoker -- 30 years    Quit date: 01/24/2009  . Smokeless tobacco: Never Used     Comment: 1/2  ppd-2ppd - quit 2010.  Marland Kitchen Alcohol Use: 0.0 oz/week    0 Standard drinks or equivalent per week     Comment: Rarel beer. 1-2/yr   Family History  Problem Relation Age of Onset  . Hypertension Mother   . Cancer Paternal Grandfather     lung  . CAD Maternal Grandmother     age 44 - CABG.  . Fibromyalgia      Sister and daughter  . Allergy (severe)     No Known Allergies Current Outpatient Prescriptions on File Prior to Visit  Medication Sig Dispense Refill  . cyclobenzaprine (FLEXERIL) 10 MG tablet Take 10 mg by mouth 2 (two) times daily as needed for muscle spasms.     Marland Kitchen lisinopril (PRINIVIL,ZESTRIL) 20 MG tablet Take 1 tablet (20 mg total) by mouth daily. 90 tablet 1  . metoprolol succinate (TOPROL-XL) 25 MG 24 hr tablet TAKE 1/2 TABLET BY MOUTH DAILY 15 tablet 4  . Multiple Vitamin (MULTIVITAMIN) tablet Take 1 tablet by mouth daily.    . naproxen sodium (ANAPROX) 220 MG tablet Take 2 by mouth twice a day 3 to 5 days a week    . oxyCODONE (OXY IR/ROXICODONE) 5 MG immediate release tablet 1-2 tabs at bedtime as needed for pain 60 tablet 0  . PARoxetine (PAXIL) 20 MG tablet Take 1 tablet (20 mg total) by mouth daily. 90 tablet 1  . Pseudoephedrine-APAP-DM (DAYQUIL PO) Take 2 capsules by mouth as needed.      No current facility-administered medications on file prior to visit.   The PMH, PSH, Social History, Family History, Medications, and allergies have been reviewed in [facility], and have been updated if relevant.   Review of Systems  Constitutional: Negative.   HENT: Negative.   Respiratory: Negative.   Cardiovascular: Negative.   Gastrointestinal: Negative.   Endocrine: Negative.   Genitourinary: Negative.   Musculoskeletal: Negative.   Allergic/Immunologic: Negative.   Neurological: Negative.   Hematological: Negative.   Psychiatric/Behavioral: Negative.   All other systems reviewed and are negative.   Physical Exam  BP 138/74 mmHg  Pulse 86  Temp(Src) 98.1 F (36.7 C)  (Oral)  Ht 6\' 1"  (1.854 m)  Wt 364 lb 12 oz (165.45 kg)  BMI 48.13 kg/m2  SpO2 95% Wt Readings from Last 3 Encounters:  04/25/15 364 lb 12 oz (165.45 kg)  08/21/14 360 lb 4 oz (163.408 kg)  08/06/14 356 lb 8 oz (161.707 kg)    Physical Exam  Constitutional: No distress.  Morbid obesity  HENT:  Head: Normocephalic.  Cardiovascular: Normal rate and regular rhythm.   Pulmonary/Chest: Effort normal and breath sounds normal.  Musculoskeletal: Normal range of motion. He exhibits no edema.  Neurological: He is alert.  GCS score is 15.  Skin: Skin is warm and dry. He is not diaphoretic.  Psychiatric: Mood, memory, affect and judgment normal.

## 2015-04-25 NOTE — Assessment & Plan Note (Signed)
Rx for oxycodone refilled today. Update pain contract.

## 2015-04-25 NOTE — Assessment & Plan Note (Signed)
PHQ score of 5. Discussed changing rx. Taking paxil daily- he does not want another or increased Rx of psychotherapy. "hard to be happy when you are in pain." Denies SI or HI.

## 2015-04-25 NOTE — Assessment & Plan Note (Addendum)
Persistent issue. He is not willing to see a nutritionist or consider bariatric surgery.

## 2015-04-26 ENCOUNTER — Encounter: Payer: Self-pay | Admitting: *Deleted

## 2015-05-14 ENCOUNTER — Encounter: Payer: Self-pay | Admitting: Family Medicine

## 2015-05-23 ENCOUNTER — Other Ambulatory Visit: Payer: Self-pay | Admitting: Neurosurgery

## 2015-05-23 DIAGNOSIS — M5440 Lumbago with sciatica, unspecified side: Secondary | ICD-10-CM

## 2015-05-27 ENCOUNTER — Other Ambulatory Visit: Payer: Self-pay

## 2015-05-27 MED ORDER — OXYCODONE HCL 5 MG PO TABS: ORAL_TABLET | ORAL | Status: DC

## 2015-05-27 NOTE — Telephone Encounter (Signed)
Pt left v/m requesting rx oxycodone. Call when ready for pick up.pt last seen and rx last printed # 60 on 04/25/15.

## 2015-05-27 NOTE — Telephone Encounter (Signed)
Spoke to pt and informed him Rx is available for pickup from the front desk 

## 2015-06-11 ENCOUNTER — Ambulatory Visit
Admission: RE | Admit: 2015-06-11 | Discharge: 2015-06-11 | Disposition: A | Discharge status: Home / Self Care | Payer: Medicare Other | Source: Ambulatory Visit | Attending: Neurosurgery | Admitting: Neurosurgery

## 2015-06-11 ENCOUNTER — Other Ambulatory Visit: Payer: Medicare Other

## 2015-06-11 DIAGNOSIS — M5440 Lumbago with sciatica, unspecified side: Secondary | ICD-10-CM

## 2015-06-11 MED ORDER — GADOBENATE DIMEGLUMINE 529 MG/ML IV SOLN
20.0000 mL | Freq: Once | INTRAVENOUS | Status: AC | PRN
  Administered 2015-06-11: 20 mL via INTRAVENOUS

## 2015-07-01 ENCOUNTER — Other Ambulatory Visit: Payer: Self-pay

## 2015-07-01 MED ORDER — OXYCODONE HCL 5 MG PO TABS: ORAL_TABLET | ORAL | Status: DC

## 2015-07-01 NOTE — Telephone Encounter (Signed)
Pt left v/m requesting rx oxycodone.call when ready for pick up. rx last printed # 60 on 05/27/15. Last annual exam 04/25/2015.

## 2015-07-01 NOTE — Telephone Encounter (Signed)
Spoke to pt and informed him Rx is available for pickup from the front desk 

## 2015-07-30 ENCOUNTER — Other Ambulatory Visit: Payer: Self-pay

## 2015-07-30 MED ORDER — OXYCODONE HCL 5 MG PO TABS: ORAL_TABLET | ORAL | Status: DC

## 2015-07-30 NOTE — Telephone Encounter (Signed)
Spoke to pt and informed him Rx is available for pickup from the front desk 

## 2015-07-30 NOTE — Telephone Encounter (Signed)
Pt left v/m requesting oxycodone. Call when ready for pick up. rx last printed # 60 on 07/01/15. Last seen 04/25/15. Pt changing pharmacy to [facility].

## 2015-08-27 ENCOUNTER — Other Ambulatory Visit: Payer: Self-pay | Admitting: *Deleted

## 2015-08-27 MED ORDER — OXYCODONE HCL 5 MG PO TABS: ORAL_TABLET | ORAL | Status: DC

## 2015-08-27 NOTE — Telephone Encounter (Signed)
Last filled #60 on 07/30/15.  Patient last seen at medicare wellness exam on 04/25/15.  Okay to refill?

## 2015-08-27 NOTE — Telephone Encounter (Signed)
Patient notified by telephone that script is up front ready for pickup. 

## 2015-09-25 ENCOUNTER — Other Ambulatory Visit: Payer: Self-pay | Admitting: Family Medicine

## 2015-09-25 ENCOUNTER — Other Ambulatory Visit: Payer: Self-pay | Admitting: *Deleted

## 2015-09-25 NOTE — Telephone Encounter (Signed)
RX printed and signed and given to Waynetta 

## 2015-09-25 NOTE — Telephone Encounter (Signed)
Last filled #60 on 08/27/15.  Last annual exam 04/25/15.  Okay to refill?

## 2015-09-26 MED ORDER — OXYCODONE HCL 5 MG PO TABS: ORAL_TABLET | ORAL | Status: DC

## 2015-09-26 NOTE — Telephone Encounter (Signed)
Spoke to pt and informed him Rx is available for pickup from the front desk 

## 2015-10-25 ENCOUNTER — Other Ambulatory Visit: Payer: Self-pay | Admitting: Family Medicine

## 2015-10-29 ENCOUNTER — Ambulatory Visit (INDEPENDENT_AMBULATORY_CARE_PROVIDER_SITE_OTHER): Payer: Medicare Other | Admitting: Family Medicine

## 2015-10-29 ENCOUNTER — Other Ambulatory Visit: Payer: Self-pay | Admitting: Family Medicine

## 2015-10-29 ENCOUNTER — Encounter: Payer: Self-pay | Admitting: Family Medicine

## 2015-10-29 ENCOUNTER — Ambulatory Visit (INDEPENDENT_AMBULATORY_CARE_PROVIDER_SITE_OTHER)
Admission: RE | Admit: 2015-10-29 | Discharge: 2015-10-29 | Disposition: A | Discharge status: Home / Self Care | Payer: Medicare Other | Source: Ambulatory Visit | Attending: Family Medicine | Admitting: Family Medicine

## 2015-10-29 VITALS — BP 152/84 | HR 85 | Temp 98.1°F | Wt 364.5 lb

## 2015-10-29 DIAGNOSIS — G894 Chronic pain syndrome: Secondary | ICD-10-CM

## 2015-10-29 DIAGNOSIS — M25561 Pain in right knee: Secondary | ICD-10-CM

## 2015-10-29 DIAGNOSIS — M25562 Pain in left knee: Secondary | ICD-10-CM

## 2015-10-29 MED ORDER — OXYCODONE HCL 5 MG PO TABS: ORAL_TABLET | ORAL | Status: DC

## 2015-10-29 NOTE — Progress Notes (Signed)
Subjective:   Patient ID: Richard Mcguire, male    DOB: 1971-04-14, 45 y.o.   MRN: [id number]  Richard Mcguire is a pleasant 45 y.o. year old male who presents to clinic today with Follow-up and Knee Pain  on 10/29/2015  HPI:  Chronic back pain -h/o L5 fusion (per pt, "didn't take.")- s/p epidural injection i n9/2016. States he is in pain constantly. Takes flexeril and tramadol and feels they take the edge off but nothing takes away the pain. States that Dr. Rita Ohara has stated he is not a candidate for further surgeries.   Started Oxycodone 5 mg every 4 hours as needed- taking 2 tablets at bedtime and that helps him sleep. Still needing this.  Now both knees have been hurting for past few months, left greater than right.  Pain is worse when walking.  Feeling like it may give out.  Having some numbness down both legs but he thinks this may be coming from his back.  Was doing well with his diet until his mom died last month.  Trying to get back on track.  Wt Readings from Last 3 Encounters:  10/29/15 364 lb 8 oz (165.336 kg)  06/11/15 370 lb (167.831 kg)  04/25/15 364 lb 12 oz (165.45 kg)   Current Outpatient Prescriptions on File Prior to Visit  Medication Sig Dispense Refill  . cyclobenzaprine (FLEXERIL) 10 MG tablet Take 10 mg by mouth 2 (two) times daily as needed for muscle spasms.     Marland Kitchen lisinopril (PRINIVIL,ZESTRIL) 20 MG tablet take 1 tablet by mouth daily 90 tablet 0  . metoprolol succinate (TOPROL-XL) 25 MG 24 hr tablet take 1/2 tablet by mouth daily 45 tablet 1  . Multiple Vitamin (MULTIVITAMIN) tablet Take 1 tablet by mouth daily.    . naproxen sodium (ANAPROX) 220 MG tablet Take 2 by mouth twice a day 3 to 5 days a week    . PARoxetine (PAXIL) 20 MG tablet take 1 tablet by mouth daily 90 tablet 0  . Pseudoephedrine-APAP-DM (DAYQUIL PO) Take 2 capsules by mouth as needed.      No current facility-administered medications on file prior to visit.    No Known  Allergies  Past Medical History  Diagnosis Date  . Major depressive disorder, recurrent episode, moderate ([place])   . Morbid obesity ([place])   . Chronic pain   . Anxiety   . Lumbar disc disease with radiculopathy     sees Dr. Rita Ohara  . HTN (hypertension)   . Tachycardia     a. 07/2012 - placed on BB. CT angio neg.  . Migraine headache   . Chronic fatigue   . Kidney stone   . Urethral stricture     Was told he would need pediatric cath if ever had urinary cath  . Dyslipidemia     Past Surgical History  Procedure Laterality Date  . Lumbar fusion  09/2008    Dr. Sherwood Gambler  . Fatty tumor removal    . Cardiac catheterization  05/2013    minimal coronary plaque with normal LV fxn (hochrein)  . Left heart catheterization with coronary angiogram N/A 06/13/2013    Procedure: LEFT HEART CATHETERIZATION WITH CORONARY ANGIOGRAM;  Surgeon: Minus Breeding, MD;  Location: [facility] CATH LAB;  Service: Cardiovascular;  Laterality: N/A;    Family History  Problem Relation Age of Onset  . Hypertension Mother   . Cancer Paternal Grandfather     lung  . CAD Maternal Grandmother  age 33 - CABG.  . Fibromyalgia      Sister and daughter  . Allergy (severe)      Social History   Social History  . Marital Status: Married    Spouse Name: N/A  . Number of Children: 1  . Years of Education: N/A   Occupational History  . disabled     former truck Geophysicist/field seismologist   Social History Main Topics  . Smoking status: Former Smoker -- 30 years    Quit date: 01/24/2009  . Smokeless tobacco: Never Used     Comment: 1/2 ppd-2ppd - quit 2010.  Marland Kitchen Alcohol Use: 0.0 oz/week    0 Standard drinks or equivalent per week     Comment: Rarel beer. 1-2/yr  . Drug Use: No  . Sexual Activity: Not on file   Other Topics Concern  . Not on file   Social History Narrative   Lives with wife and daughter.  Daughter has multiple problems, including chronic fatigue syndrome.   Does not have living will.  Would desire CPR and  life support if not futile or prolonged.   The PMH, PSH, Social History, Family History, Medications, and allergies have been reviewed in [facility], and have been updated if relevant.  Review of Systems  Cardiovascular: Negative.   Musculoskeletal: Positive for back pain and arthralgias.  Psychiatric/Behavioral: Negative.   All other systems reviewed and are negative.      Objective:    BP 152/84 mmHg  Pulse 85  Temp(Src) 98.1 F (36.7 C) (Oral)  Wt 364 lb 8 oz (165.336 kg)  SpO2 98%   Physical Exam  Constitutional: He is oriented to person, place, and time. No distress.  Morbidly obese  HENT:  Head: Normocephalic and atraumatic.  Eyes: Conjunctivae are normal.  Cardiovascular: Normal rate.   Pulmonary/Chest: Effort normal.  Musculoskeletal:       Left knee: He exhibits no swelling, no effusion and no LCL laxity. Tenderness found. Medial joint line and patellar tendon tenderness noted.  Neurological: He is alert and oriented to person, place, and time.  Skin: Skin is warm and dry.  Psychiatric: He has a normal mood and affect. His behavior is normal. Judgment and thought content normal.  Nursing note and vitals reviewed.         Assessment & Plan:   Chronic pain syndrome  Morbid obesity due to excess calories (H[facility])  Bilateral knee pain - Plan: DG Knee Complete 4 Views Left No Follow-up on file.

## 2015-10-29 NOTE — Progress Notes (Signed)
Pre visit review using our clinic review tool, if applicable. No additional management support is needed unless otherwise documented below in the visit note. 

## 2015-10-29 NOTE — Patient Instructions (Signed)
Good to see you. I am so sorry for your loss.  Please call your insurance company to inquire about bariatric surgery coverage.  I will call you about your knee xray results.

## 2015-10-29 NOTE — Assessment & Plan Note (Signed)
New- likely multifactorial and we discussed again today that his weight is certainly contributing.  He will call his insurance company to inquire about bariatric surgery coverage. Xray today to look at his knee- could be some OA, also involvement from his back. The patient indicates understanding of these issues and agrees with the plan. Oxycodone rx refilled today.

## 2015-12-03 ENCOUNTER — Other Ambulatory Visit: Payer: Self-pay

## 2015-12-03 ENCOUNTER — Encounter: Payer: Self-pay | Admitting: Family Medicine

## 2015-12-03 MED ORDER — OXYCODONE HCL 5 MG PO TABS: ORAL_TABLET | ORAL | Status: DC

## 2015-12-03 NOTE — Telephone Encounter (Signed)
Pt left v/m requesting rx oxycodone. Call when ready for pick up. Pt last seen and rx last printed # 60 on 10/29/15.

## 2015-12-03 NOTE — Telephone Encounter (Signed)
Spoke to pt and informed him Rx is available for pickup from the front desk. Pt advised third party unable to pickup .  

## 2015-12-19 ENCOUNTER — Encounter: Payer: Self-pay | Admitting: Family Medicine

## 2015-12-31 ENCOUNTER — Other Ambulatory Visit: Payer: Self-pay

## 2015-12-31 ENCOUNTER — Encounter: Payer: Self-pay | Admitting: Family Medicine

## 2015-12-31 MED ORDER — OXYCODONE HCL 5 MG PO TABS: ORAL_TABLET | ORAL | Status: DC

## 2015-12-31 NOTE — Telephone Encounter (Signed)
Spoke to pt and informed him Rx is available for pickup from the front desk. Pt advised third party unable to pickup -high risk UDS

## 2015-12-31 NOTE — Telephone Encounter (Signed)
Pt left v/m requesting rx [email or web address].call when ready for pick up. Last printed # 60 on 12/03/15.last seen 10/29/15.

## 2016-01-16 ENCOUNTER — Encounter: Payer: Self-pay | Admitting: Family Medicine

## 2016-01-30 ENCOUNTER — Other Ambulatory Visit: Payer: Self-pay

## 2016-01-30 NOTE — Telephone Encounter (Signed)
Pt left v/m requesting rx oxycodone. Call when ready for pick up. Last printed # 60 on 12/31/15; last seen on 10/29/15. Dr Deborra Medina out of office.

## 2016-01-31 ENCOUNTER — Encounter: Payer: Self-pay | Admitting: Family Medicine

## 2016-01-31 MED ORDER — OXYCODONE HCL 5 MG PO TABS: ORAL_TABLET | ORAL | Status: DC

## 2016-01-31 NOTE — Telephone Encounter (Signed)
Dr. Deborra Medina last note reviewed. RX printed and signed and given to [facility]

## 2016-01-31 NOTE — Telephone Encounter (Signed)
Spoke to pt and informed him Rx is available for pickup from the front desk. Pt advised third party unable to pickup .  

## 2016-02-03 ENCOUNTER — Other Ambulatory Visit: Payer: Self-pay | Admitting: Family Medicine

## 2016-02-18 ENCOUNTER — Encounter: Payer: Self-pay | Admitting: Family Medicine

## 2016-02-25 ENCOUNTER — Other Ambulatory Visit: Payer: Self-pay

## 2016-02-25 NOTE — Telephone Encounter (Signed)
Pt left vm requesting rx oxycodone. Call when ready for pick up. Last printed # 60 on 01/31/16;last seen 10/29/15.

## 2016-02-25 NOTE — Telephone Encounter (Signed)
Ok to print and put on my desk for signature. 

## 2016-02-26 MED ORDER — OXYCODONE HCL 5 MG PO TABS: ORAL_TABLET | ORAL | 0 refills | Status: DC

## 2016-02-26 NOTE — Telephone Encounter (Signed)
Spoke to pt and informed him Rx is available for pickup from the front desk 

## 2016-04-01 ENCOUNTER — Ambulatory Visit (INDEPENDENT_AMBULATORY_CARE_PROVIDER_SITE_OTHER): Payer: Medicare Other | Admitting: Family Medicine

## 2016-04-01 ENCOUNTER — Encounter: Payer: Self-pay | Admitting: Family Medicine

## 2016-04-01 VITALS — BP 136/62 | HR 82 | Temp 98.4°F | Wt 367.8 lb

## 2016-04-01 DIAGNOSIS — L0390 Cellulitis, unspecified: Secondary | ICD-10-CM

## 2016-04-01 DIAGNOSIS — Z23 Encounter for immunization: Secondary | ICD-10-CM

## 2016-04-01 DIAGNOSIS — G894 Chronic pain syndrome: Secondary | ICD-10-CM | POA: Diagnosis not present

## 2016-04-01 DIAGNOSIS — L0291 Cutaneous abscess, unspecified: Secondary | ICD-10-CM | POA: Diagnosis not present

## 2016-04-01 MED ORDER — SULFAMETHOXAZOLE-TRIMETHOPRIM 800-160 MG PO TABS: 1.0000 | ORAL_TABLET | Freq: Two times a day (BID) | ORAL | 0 refills | Status: DC

## 2016-04-01 MED ORDER — OXYCODONE HCL 5 MG PO TABS: ORAL_TABLET | ORAL | 0 refills | Status: DC

## 2016-04-01 NOTE — Progress Notes (Signed)
Pre visit review using our clinic review tool, if applicable. No additional management support is needed unless otherwise documented below in the visit note. 

## 2016-04-01 NOTE — Assessment & Plan Note (Signed)
New- appears to have drained, no pus able to be expressed from it. Place on bactrim ds twice daily x 10 days. Call or return to clinic prn if these symptoms worsen or fail to improve as anticipated. The patient indicates understanding of these issues and agrees with the plan.

## 2016-04-01 NOTE — Patient Instructions (Signed)
We will call you with your pain clinic referral.  Take Bactrim as directed- 1 tablet twice daily for 10 days.  Your wife is very smart.  Please keep me updated.

## 2016-04-01 NOTE — Assessment & Plan Note (Signed)
Deteriorated. Increase dose of oxycodone to 5 mg three times daily and refer to pain clinic. The patient indicates understanding of these issues and agrees with the plan.

## 2016-04-01 NOTE — Progress Notes (Signed)
Subjective:   Patient ID: Richard Mcguire, male    DOB: [birth year], 45 y.o.   MRN: [id number]  Richard Mcguire is a pleasant 45 y.o. year old male who presents to clinic today with Follow-up (wanting to increase oxycodone) and Cyst (under L arm)  on 04/01/2016  HPI:  Chronic back pain -h/o L5 fusion (per pt, "didn't take.")- s/p epidural injection i n9/2016. States he is in pain constantly. Takes flexeril and tramadol and feels they take the edge off but nothing takes away the pain. States that Dr. Rita Ohara has stated he is not a candidate for further surgeries.   Started Oxycodone 5 mg - taking 2 tablets at bedtime and that helps him sleep. Still needing this. Wants to increase dose.  At times, needs to take one during the day.  Leg pain is getting worse.  ? Cyst on left axilla- red, warm, painful, drained "nasty stuff" and now feels better.  No fevers.  No nausea or vomiting.  .  Wt Readings from Last 3 Encounters:  04/01/16 (!) 367 lb 12 oz (166.8 kg)  10/29/15 (!) 364 lb 8 oz (165.3 kg)  06/11/15 (!) 370 lb (167.8 kg)   Current Outpatient Prescriptions on File Prior to Visit  Medication Sig Dispense Refill  . lisinopril (PRINIVIL,ZESTRIL) 20 MG tablet take 1 tablet by mouth once daily 90 tablet 0  . methocarbamol (ROBAXIN) 500 MG tablet Take 1,000 mg by mouth daily.  0  . metoprolol succinate (TOPROL-XL) 25 MG 24 hr tablet take 1/2 tablet by mouth daily 45 tablet 1  . Multiple Vitamin (MULTIVITAMIN) tablet Take 1 tablet by mouth daily.    . naproxen sodium (ANAPROX) 220 MG tablet Take 2 by mouth twice a day 3 to 5 days a week    . oxyCODONE (OXY IR/ROXICODONE) 5 MG immediate release tablet 1-2 tabs at bedtime as needed for pain 60 tablet 0  . PARoxetine (PAXIL) 20 MG tablet take 1 tablet by mouth once daily 90 tablet 0  . Pseudoephedrine-APAP-DM (DAYQUIL PO) Take 2 capsules by mouth as needed.      No current facility-administered medications on file prior to visit.      No Known Allergies  Past Medical History:  Diagnosis Date  . Anxiety   . Chronic fatigue   . Chronic pain   . Dyslipidemia   . HTN (hypertension)   . Kidney stone   . Lumbar disc disease with radiculopathy    sees Dr. Rita Ohara  . Major depressive disorder, recurrent episode, moderate ([place])   . Migraine headache   . Morbid obesity ([place])   . Tachycardia    a. 07/2012 - placed on BB. CT angio neg.  Marland Kitchen Urethral stricture    Was told he would need pediatric cath if ever had urinary cath    Past Surgical History:  Procedure Laterality Date  . CARDIAC CATHETERIZATION  05/2013   minimal coronary plaque with normal LV fxn (hochrein)  . fatty tumor removal    . LEFT HEART CATHETERIZATION WITH CORONARY ANGIOGRAM N/A 06/13/2013   Procedure: LEFT HEART CATHETERIZATION WITH CORONARY ANGIOGRAM;  Surgeon: Minus Breeding, MD;  Location: [facility] CATH LAB;  Service: Cardiovascular;  Laterality: N/A;  . LUMBAR FUSION  09/2008   Dr. Sherwood Gambler    Family History  Problem Relation Age of Onset  . Hypertension Mother   . Cancer Paternal Grandfather     lung  . CAD Maternal Grandmother     age 50 - CABG.  Marland Kitchen  Fibromyalgia      Sister and daughter  . Allergy (severe)      Social History   Social History  . Marital status: Married    Spouse name: N/A  . Number of children: 1  . Years of education: N/A   Occupational History  . disabled     former truck Geophysicist/field seismologist   Social History Main Topics  . Smoking status: Former Smoker    Years: 30.00    Quit date: 01/24/2009  . Smokeless tobacco: Never Used     Comment: 1/2 ppd-2ppd - quit 2010.  Marland Kitchen Alcohol use 0.0 oz/week     Comment: Rarel beer. 1-2/yr  . Drug use: No  . Sexual activity: Not on file   Other Topics Concern  . Not on file   Social History Narrative   Lives with wife and daughter.  Daughter has multiple problems, including chronic fatigue syndrome.   Does not have living will.  Would desire CPR and life support if not futile or  prolonged.   The PMH, PSH, Social History, Family History, Medications, and allergies have been reviewed in [facility], and have been updated if relevant.  Review of Systems  Constitutional: Negative.   Cardiovascular: Negative.   Musculoskeletal: Positive for arthralgias and back pain.  Skin: Positive for rash and wound.  Psychiatric/Behavioral: Negative.   All other systems reviewed and are negative.      Objective:    BP 136/62   Pulse 82   Temp 98.4 F (36.9 C) (Oral)   Wt (!) 367 lb 12 oz (166.8 kg)   SpO2 96%   BMI 48.52 kg/m    Physical Exam  Constitutional: He is oriented to person, place, and time. No distress.  Morbidly obese  HENT:  Head: Normocephalic and atraumatic.  Eyes: Conjunctivae are normal.  Cardiovascular: Normal rate.   Pulmonary/Chest: Effort normal.  Musculoskeletal:       Left knee: He exhibits no swelling, no effusion and no LCL laxity. Tenderness found. Medial joint line and patellar tendon tenderness noted.  Neurological: He is alert and oriented to person, place, and time.  Skin: Skin is warm and dry.  Open abscess left axilla, appears to have drained, non tender, does still have some erythema around it  Psychiatric: He has a normal mood and affect. His behavior is normal. Judgment and thought content normal.  Nursing note and vitals reviewed.         Assessment & Plan:   Need for influenza vaccination - Plan: Flu Vaccine QUAD 36+ mos PF IM (Fluarix & Fluzone Quad PF) No Follow-up on file.

## 2016-04-02 ENCOUNTER — Other Ambulatory Visit: Payer: Self-pay | Admitting: Family Medicine

## 2016-05-11 ENCOUNTER — Other Ambulatory Visit: Payer: Self-pay | Admitting: Family Medicine

## 2016-05-21 ENCOUNTER — Ambulatory Visit (INDEPENDENT_AMBULATORY_CARE_PROVIDER_SITE_OTHER): Payer: Medicare Other | Admitting: Family Medicine

## 2016-05-21 ENCOUNTER — Encounter: Payer: Self-pay | Admitting: Family Medicine

## 2016-05-21 VITALS — BP 184/94 | HR 99 | Temp 98.3°F | Wt 372.2 lb

## 2016-05-21 DIAGNOSIS — F321 Major depressive disorder, single episode, moderate: Secondary | ICD-10-CM

## 2016-05-21 DIAGNOSIS — I10 Essential (primary) hypertension: Secondary | ICD-10-CM | POA: Diagnosis not present

## 2016-05-21 DIAGNOSIS — Z981 Arthrodesis status: Secondary | ICD-10-CM | POA: Diagnosis not present

## 2016-05-21 DIAGNOSIS — G894 Chronic pain syndrome: Secondary | ICD-10-CM

## 2016-05-21 DIAGNOSIS — G8929 Other chronic pain: Secondary | ICD-10-CM

## 2016-05-21 DIAGNOSIS — G4733 Obstructive sleep apnea (adult) (pediatric): Secondary | ICD-10-CM

## 2016-05-21 DIAGNOSIS — M25562 Pain in left knee: Secondary | ICD-10-CM

## 2016-05-21 DIAGNOSIS — M25561 Pain in right knee: Secondary | ICD-10-CM

## 2016-05-21 DIAGNOSIS — F331 Major depressive disorder, recurrent, moderate: Secondary | ICD-10-CM

## 2016-05-21 LAB — LIPID PANEL
CHOLESTEROL: 198 mg/dL (ref 0–200)
HDL: 37.3 mg/dL — AB (ref >39.00)
NonHDL: 160.39
TRIGLYCERIDES: 276 mg/dL — AB (ref 0.0–149.0)
Total CHOL/HDL Ratio: 5
VLDL: 55.2 mg/dL — ABNORMAL HIGH (ref 0.0–40.0)

## 2016-05-21 LAB — COMPREHENSIVE METABOLIC PANEL
ALBUMIN: 4 g/dL (ref 3.5–5.2)
ALK PHOS: 97 U/L (ref 39–117)
ALT: 17 U/L (ref 0–53)
AST: 13 U/L (ref 0–37)
BILIRUBIN TOTAL: 0.3 mg/dL (ref 0.2–1.2)
BUN: 13 mg/dL (ref 6–23)
CALCIUM: 9 mg/dL (ref 8.4–10.5)
CO2: 29 meq/L (ref 19–32)
CREATININE: 0.78 mg/dL (ref 0.40–1.50)
Chloride: 103 mEq/L (ref 96–112)
GFR: 114.6 mL/min (ref >60.00)
Glucose, Bld: 129 mg/dL — ABNORMAL HIGH (ref 70–99)
Potassium: 3.9 mEq/L (ref 3.5–5.1)
Sodium: 138 mEq/L (ref 135–145)
TOTAL PROTEIN: 6.9 g/dL (ref 6.0–8.3)

## 2016-05-21 LAB — LDL CHOLESTEROL, DIRECT: Direct LDL: 141 mg/dL

## 2016-05-21 MED ORDER — METOPROLOL SUCCINATE ER 25 MG PO TB24: 12.5000 mg | ORAL_TABLET | Freq: Every day | ORAL | 6 refills | Status: DC

## 2016-05-21 NOTE — Assessment & Plan Note (Signed)
Depression and anxiety well controlled with Paxil. No changes made today.

## 2016-05-21 NOTE — Assessment & Plan Note (Signed)
Deteriorated but asymptomatic. Ran out of rxs- eRx refills sent.  Labs today. Advised to take his medication ASAP. The patient indicates understanding of these issues and agrees with the plan.

## 2016-05-21 NOTE — Assessment & Plan Note (Signed)
Pain is now controlled, managed by pain clinic.

## 2016-05-21 NOTE — Progress Notes (Signed)
Subjective:   Patient ID: Richard Mcguire, male    DOB: [birth year], 45 y.o.   MRN: [id number]  Richard Mcguire is a pleasant 45 y.o. year old male who presents to clinic today with Follow-up  on 05/21/2016  HPI:  Chronic back and knee pain-  Last saw him on 04/01/16.  Note reviewed.  Was complaining of being in constant pain.  Was taking flexeril and tramadol.  Dr. Rita Ohara told pt he was not a surgical candidate. We therefore added oxycodone 5 mg - 2 tabs at bedtime in 10/2015.  Increased dose to 5 mg three times daily in 03/2016 and referred him to the pain clinic. Followed at pain clinic now-= followed by Dr. Andree Elk.  Now on Percocet and zanaflex which has improved his pain control.   HTN- BP is very elevatred today.  Taking lisinopril 20 mg daily and Toprol 12.5 mg daily.  Ran out of his rxs. Denies HA, blurred vision, CP or SOB. Lab Results  Component Value Date   CREATININE 0.92 04/25/2015   Anxiety- feels paxil has been helping.  Has been under more stress lately. Current Outpatient Prescriptions on File Prior to Visit  Medication Sig Dispense Refill  . lisinopril (PRINIVIL,ZESTRIL) 20 MG tablet take 1 tablet by mouth once daily 90 tablet 0  . metoprolol succinate (TOPROL-XL) 25 MG 24 hr tablet Take 0.5 tablets (12.5 mg total) by mouth daily. COMPLETE PHYSICAL EXAM REQUIRED FOR ADDITIONAL REFILLS 15 tablet 0  . Multiple Vitamin (MULTIVITAMIN) tablet Take 1 tablet by mouth daily.    . naproxen sodium (ANAPROX) 220 MG tablet Take 2 by mouth twice a day 3 to 5 days a week    . PARoxetine (PAXIL) 20 MG tablet take 1 tablet by mouth once daily 90 tablet 0  . Pseudoephedrine-APAP-DM (DAYQUIL PO) Take 2 capsules by mouth as needed.      No current facility-administered medications on file prior to visit.     No Known Allergies  Past Medical History:  Diagnosis Date  . Anxiety   . Chronic fatigue   . Chronic pain   . Dyslipidemia   . HTN (hypertension)   . Kidney  stone   . Lumbar disc disease with radiculopathy    sees Dr. Rita Ohara  . Major depressive disorder, recurrent episode, moderate ([place])   . Migraine headache   . Morbid obesity ([place])   . Tachycardia    a. 07/2012 - placed on BB. CT angio neg.  Marland Kitchen Urethral stricture    Was told he would need pediatric cath if ever had urinary cath    Past Surgical History:  Procedure Laterality Date  . CARDIAC CATHETERIZATION  05/2013   minimal coronary plaque with normal LV fxn (hochrein)  . fatty tumor removal    . LEFT HEART CATHETERIZATION WITH CORONARY ANGIOGRAM N/A 06/13/2013   Procedure: LEFT HEART CATHETERIZATION WITH CORONARY ANGIOGRAM;  Surgeon: Minus Breeding, MD;  Location: [facility] CATH LAB;  Service: Cardiovascular;  Laterality: N/A;  . LUMBAR FUSION  09/2008   Dr. Sherwood Gambler    Family History  Problem Relation Age of Onset  . Hypertension Mother   . Cancer Paternal Grandfather     lung  . CAD Maternal Grandmother     age 71 - CABG.  . Fibromyalgia      Sister and daughter  . Allergy (severe)      Social History   Social History  . Marital status: Married    Spouse name: N/A  .  Number of children: 1  . Years of education: N/A   Occupational History  . disabled     former truck Geophysicist/field seismologist   Social History Main Topics  . Smoking status: Former Smoker    Years: 30.00    Quit date: 01/24/2009  . Smokeless tobacco: Never Used     Comment: 1/2 ppd-2ppd - quit 2010.  Marland Kitchen Alcohol use 0.0 oz/week     Comment: Rarel beer. 1-2/yr  . Drug use: No  . Sexual activity: Not on file   Other Topics Concern  . Not on file   Social History Narrative   Lives with wife and daughter.  Daughter has multiple problems, including chronic fatigue syndrome.   Does not have living will.  Would desire CPR and life support if not futile or prolonged.   The PMH, PSH, Social History, Family History, Medications, and allergies have been reviewed in [facility], and have been updated if relevant.     Review of  Systems  Constitutional: Negative.   HENT: Negative.   Eyes: Negative.   Respiratory: Negative.   Cardiovascular: Negative.   Gastrointestinal: Negative.   Endocrine: Negative.   Genitourinary: Negative.   Musculoskeletal: Negative.   Allergic/Immunologic: Negative.   Neurological: Negative.   Hematological: Negative.   Psychiatric/Behavioral: Negative.        Objective:    BP (!) 184/94   Pulse 99   Temp 98.3 F (36.8 C) (Oral)   Wt (!) 372 lb 4 oz (168.9 kg)   SpO2 98%   BMI 49.11 kg/m    Physical Exam  Constitutional: He is oriented to person, place, and time. He appears well-developed and well-nourished. No distress.  HENT:  Head: Normocephalic and atraumatic.  Eyes: Conjunctivae are normal.  Cardiovascular: Regular rhythm.   Pulmonary/Chest: Effort normal and breath sounds normal.  Musculoskeletal: Normal range of motion.  Neurological: He is alert and oriented to person, place, and time. No cranial nerve deficit.  Skin: Skin is warm and dry. He is not diaphoretic.  Psychiatric: He has a normal mood and affect. His behavior is normal. Judgment and thought content normal.  Nursing note and vitals reviewed.         Assessment & Plan:   Chronic pain syndrome  Moderate single current episode of major depressive disorder (HCC)  OSA (obstructive sleep apnea)  Morbid obesity (HCC)  Essential hypertension  History of lumbar fusion  Chronic pain of both knees No Follow-up on file.

## 2016-05-21 NOTE — Progress Notes (Signed)
Pre visit review using our clinic review tool, if applicable. No additional management support is needed unless otherwise documented below in the visit note. 

## 2016-08-18 ENCOUNTER — Other Ambulatory Visit: Payer: Self-pay | Admitting: Family Medicine

## 2016-11-09 ENCOUNTER — Encounter: Payer: Self-pay | Admitting: Family Medicine

## 2016-11-09 ENCOUNTER — Ambulatory Visit (INDEPENDENT_AMBULATORY_CARE_PROVIDER_SITE_OTHER): Payer: Medicare Other | Admitting: Family Medicine

## 2016-11-09 DIAGNOSIS — L409 Psoriasis, unspecified: Secondary | ICD-10-CM | POA: Diagnosis not present

## 2016-11-09 DIAGNOSIS — R300 Dysuria: Secondary | ICD-10-CM | POA: Diagnosis not present

## 2016-11-09 LAB — LIPID PANEL
Cholesterol: 181 mg/dL (ref 0–200)
HDL: 37.9 mg/dL — AB (ref >39.00)
LDL CALC: 119 mg/dL — AB (ref 0–99)
NonHDL: 143.47
TRIGLYCERIDES: 121 mg/dL (ref 0.0–149.0)
Total CHOL/HDL Ratio: 5
VLDL: 24.2 mg/dL (ref 0.0–40.0)

## 2016-11-09 LAB — POC URINALSYSI DIPSTICK (AUTOMATED)
BILIRUBIN UA: NEGATIVE
GLUCOSE UA: NEGATIVE
KETONES UA: NEGATIVE
Leukocytes, UA: NEGATIVE
NITRITE UA: NEGATIVE
Protein, UA: NEGATIVE
RBC UA: NEGATIVE
Spec Grav, UA: >=1.03 — AB (ref 1.010–1.025)
Urobilinogen, UA: 0.2 E.U./dL
pH, UA: 5.5 (ref 5.0–8.0)

## 2016-11-09 LAB — SEDIMENTATION RATE: SED RATE: 12 mm/h (ref 0–15)

## 2016-11-09 LAB — C-REACTIVE PROTEIN: CRP: 1.2 mg/dL (ref 0.5–20.0)

## 2016-11-09 MED ORDER — NYSTATIN-TRIAMCINOLONE 100000-0.1 UNIT/GM-% EX OINT: 1.0000 "application " | TOPICAL_OINTMENT | Freq: Two times a day (BID) | CUTANEOUS | 0 refills | Status: DC

## 2016-11-09 NOTE — Patient Instructions (Signed)
Great to see you.  I will send a my chart message with your lab results.

## 2016-11-09 NOTE — Assessment & Plan Note (Signed)
Letter written as requested.

## 2016-11-09 NOTE — Progress Notes (Signed)
Pre visit review using our clinic review tool, if applicable. No additional management support is needed unless otherwise documented below in the visit note. 

## 2016-11-09 NOTE — Assessment & Plan Note (Signed)
Check rheum labs today.

## 2016-11-09 NOTE — Progress Notes (Signed)
Subjective:   Patient ID: Richard Mcguire, male    DOB: 1971-05-15, 46 y.o.   MRN: [id number]  Richard Mcguire is a pleasant 46 y.o. year old male who presents to clinic today with Discuss Diet (Went to Bariatric Seminar.) and Dry Skin (Pt says skin around penis is dry)  on 11/09/2016  HPI:  Obesity- went to obesity seminar and [facility]. Considering gastric sleeve. Needs a letter of medical necessity per pt.   Also concerned he has psoriatic arthritis. Has been going to the pain clinic and the PA asked him if he was ever screened for this since he does have psoriasis (skin manifestations).  Has had some intermittent dysuria as well. No back pain, increased frequency or hematuria.    Current Outpatient Prescriptions on File Prior to Visit  Medication Sig Dispense Refill  . lisinopril (PRINIVIL,ZESTRIL) 20 MG tablet take 1 tablet by mouth once daily 90 tablet 2  . metoprolol succinate (TOPROL-XL) 25 MG 24 hr tablet Take 0.5 tablets (12.5 mg total) by mouth daily. 30 tablet 6  . Multiple Vitamin (MULTIVITAMIN) tablet Take 1 tablet by mouth daily.    . naproxen sodium (ANAPROX) 220 MG tablet Take 2 by mouth twice a day    . PARoxetine (PAXIL) 20 MG tablet take 1 tablet by mouth once daily 90 tablet 0  . Pseudoephedrine-APAP-DM (DAYQUIL PO) Take 2 capsules by mouth as needed.      No current facility-administered medications on file prior to visit.     No Known Allergies  Past Medical History:  Diagnosis Date  . Anxiety   . Chronic fatigue   . Chronic pain   . Dyslipidemia   . HTN (hypertension)   . Kidney stone   . Lumbar disc disease with radiculopathy    sees Dr. Rita Ohara  . Major depressive disorder, recurrent episode, moderate ([place])   . Migraine headache   . Morbid obesity ([place])   . Tachycardia    a. 07/2012 - placed on BB. CT angio neg.  Marland Kitchen Urethral stricture    Was told he would need pediatric cath if ever had urinary cath    Past Surgical History:    Procedure Laterality Date  . CARDIAC CATHETERIZATION  05/2013   minimal coronary plaque with normal LV fxn (hochrein)  . fatty tumor removal    . LEFT HEART CATHETERIZATION WITH CORONARY ANGIOGRAM N/A 06/13/2013   Procedure: LEFT HEART CATHETERIZATION WITH CORONARY ANGIOGRAM;  Surgeon: Minus Breeding, MD;  Location: [facility] CATH LAB;  Service: Cardiovascular;  Laterality: N/A;  . LUMBAR FUSION  09/2008   Dr. Sherwood Gambler    Family History  Problem Relation Age of Onset  . Hypertension Mother   . Cancer Paternal Grandfather     lung  . CAD Maternal Grandmother     age 40 - CABG.  . Fibromyalgia      Sister and daughter  . Allergy (severe)      Social History   Social History  . Marital status: Married    Spouse name: N/A  . Number of children: 1  . Years of education: N/A   Occupational History  . disabled     former truck Geophysicist/field seismologist   Social History Main Topics  . Smoking status: Former Smoker    Years: 30.00    Quit date: 01/24/2009  . Smokeless tobacco: Never Used     Comment: 1/2 ppd-2ppd - quit 2010.  Marland Kitchen Alcohol use 0.0 oz/week     Comment: Rarel  beer. 1-2/yr  . Drug use: No  . Sexual activity: Not on file   Other Topics Concern  . Not on file   Social History Narrative   Lives with wife and daughter.  Daughter has multiple problems, including chronic fatigue syndrome.   Does not have living will.  Would desire CPR and life support if not futile or prolonged.   The PMH, PSH, Social History, Family History, Medications, and allergies have been reviewed in [facility], and have been updated if relevant.  Review of Systems  Constitutional: Negative.   Cardiovascular: Negative.   Gastrointestinal: Negative.   Genitourinary: Positive for dysuria, penile pain and penile swelling. Negative for discharge, enuresis, flank pain, frequency, genital sores and hematuria.  Musculoskeletal: Positive for myalgias.  Skin: Positive for rash.  All other systems reviewed and are negative.       Objective:    BP 122/70 (BP Location: Left Arm, Patient Position: Sitting, Cuff Size: Large)   Pulse 60   Temp 98.5 F (36.9 C) (Oral)   Wt (!) 362 lb (164.2 kg)   SpO2 98%   BMI 47.76 kg/m    Physical Exam  Constitutional: He is oriented to person, place, and time.  obese  HENT:  Head: Normocephalic and atraumatic.  Eyes: Conjunctivae are normal.  Cardiovascular: Normal rate.   Pulmonary/Chest: Effort normal.  Musculoskeletal: Normal range of motion.  Neurological: He is alert and oriented to person, place, and time. No cranial nerve deficit.  Skin: Skin is warm and dry.  Psychiatric: He has a normal mood and affect. His behavior is normal. Judgment and thought content normal.  Nursing note reviewed.         Assessment & Plan:   Morbid obesity ([place]) No Follow-up on file.

## 2016-11-10 LAB — RHEUMATOID FACTOR

## 2016-11-10 LAB — CYCLIC CITRUL PEPTIDE ANTIBODY, IGG: Cyclic Citrullin Peptide Ab: 25 Units — ABNORMAL HIGH

## 2016-11-24 ENCOUNTER — Other Ambulatory Visit: Payer: Self-pay | Admitting: Family Medicine

## 2016-11-24 NOTE — Telephone Encounter (Signed)
Last refill 08/19/16 #90, last OV 11/09/16.

## 2017-02-15 ENCOUNTER — Ambulatory Visit (INDEPENDENT_AMBULATORY_CARE_PROVIDER_SITE_OTHER): Payer: Medicare Other | Admitting: Family Medicine

## 2017-02-15 ENCOUNTER — Encounter: Payer: Self-pay | Admitting: Family Medicine

## 2017-02-15 VITALS — BP 120/70 | HR 80 | Ht 73.0 in | Wt 358.0 lb

## 2017-02-15 DIAGNOSIS — R351 Nocturia: Secondary | ICD-10-CM | POA: Diagnosis not present

## 2017-02-15 DIAGNOSIS — G43909 Migraine, unspecified, not intractable, without status migrainosus: Secondary | ICD-10-CM | POA: Diagnosis not present

## 2017-02-15 DIAGNOSIS — L409 Psoriasis, unspecified: Secondary | ICD-10-CM | POA: Diagnosis not present

## 2017-02-15 MED ORDER — TRIAMCINOLONE ACETONIDE 0.1 % EX CREA: 1.0000 "application " | TOPICAL_CREAM | Freq: Two times a day (BID) | CUTANEOUS | 0 refills | Status: DC

## 2017-02-15 MED ORDER — SUMATRIPTAN SUCCINATE 50 MG PO TABS: 50.0000 mg | ORAL_TABLET | ORAL | 0 refills | Status: DC | PRN

## 2017-02-15 NOTE — Patient Instructions (Addendum)
Great to see you.  We are starting imitrex as needed.  Please try not to take this more than twice a month.  Keep a headache journal.  Triamcinolone cream to psoriasis areas  We are referring you to a urologist.

## 2017-02-15 NOTE — Assessment & Plan Note (Signed)
New- eRx sent for triamcinolone cream to use as needed. Call or return to clinic prn if these symptoms worsen or fail to improve as anticipated. The patient indicates understanding of these issues and agrees with the plan.

## 2017-02-15 NOTE — Assessment & Plan Note (Signed)
New- advised to keep a HA journal. eRx sent for imitrex for abortive therapy- discussed importance of not taking too frequently- re: medication overuse HA. Call or return to clinic prn if these symptoms worsen or fail to improve as anticipated. The patient indicates understanding of these issues and agrees with the plan.

## 2017-02-15 NOTE — Assessment & Plan Note (Signed)
Check CMET- he is fasting today- ? Diabetes, less than prostate issue.  Does not have hesitation or difficulty urinating.  Check PSA and refer to urology given family hisotry.

## 2017-02-15 NOTE — Progress Notes (Signed)
Subjective:   Patient ID: Richard Mcguire, male    DOB: 05-Feb-1971, 46 y.o.   MRN: [id number]  Richard Mcguire is a pleasant 46 y.o. year old male who presents to clinic today with Psoriasis (prostate ) and Migraine (almost every night )  on 02/15/2017  HPI:  Several concerns today.  Psorasis is worse.  Has not been using anything for it- elbows and knees bilaterally. Itchy, sometimes scabs from scratching.  Migraines- associated with nausea and photophobia.  Per pt, his pain doctor thinks the narcotics are actually triggering them. Had 3 last month. Mom has a h/o severe migraines.  ?prostate issues- he is not sure but thinks maybe his dad had prostate CA.  He gets up now 3 or 4 times a night and has been for at least 6 months.  No dysuria or pain.  Current Outpatient Prescriptions on File Prior to Visit  Medication Sig Dispense Refill  . lisinopril (PRINIVIL,ZESTRIL) 20 MG tablet take 1 tablet by mouth once daily 90 tablet 2  . metoprolol succinate (TOPROL-XL) 25 MG 24 hr tablet Take 0.5 tablets (12.5 mg total) by mouth daily. 30 tablet 6  . morphine (MSIR) 30 MG tablet Take 30 mg by mouth 2 (two) times daily.  0  . Multiple Vitamin (MULTIVITAMIN) tablet Take 1 tablet by mouth daily.    . naproxen sodium (ANAPROX) 220 MG tablet Take 2 by mouth twice a day    . oxyCODONE-acetaminophen (PERCOCET) 10-325 MG tablet Take 1 tablet by mouth 3 (three) times daily.  0  . PARoxetine (PAXIL) 20 MG tablet take 1 tablet by mouth once daily 90 tablet 0  . Pseudoephedrine-APAP-DM (DAYQUIL PO) Take 2 capsules by mouth as needed.     Marland Kitchen tiZANidine (ZANAFLEX) 4 MG tablet Take 1 tablet by mouth 2 (two) times daily.  0   No current facility-administered medications on file prior to visit.     No Known Allergies  Past Medical History:  Diagnosis Date  . Anxiety   . Chronic fatigue   . Chronic pain   . Dyslipidemia   . HTN (hypertension)   . Kidney stone   . Lumbar disc disease with  radiculopathy    sees Dr. Rita Ohara  . Major depressive disorder, recurrent episode, moderate ([place])   . Migraine headache   . Morbid obesity ([place])   . Tachycardia    a. 07/2012 - placed on BB. CT angio neg.  Marland Kitchen Urethral stricture    Was told he would need pediatric cath if ever had urinary cath    Past Surgical History:  Procedure Laterality Date  . CARDIAC CATHETERIZATION  05/2013   minimal coronary plaque with normal LV fxn (hochrein)  . fatty tumor removal    . LEFT HEART CATHETERIZATION WITH CORONARY ANGIOGRAM N/A 06/13/2013   Procedure: LEFT HEART CATHETERIZATION WITH CORONARY ANGIOGRAM;  Surgeon: Minus Breeding, MD;  Location: [facility] CATH LAB;  Service: Cardiovascular;  Laterality: N/A;  . LUMBAR FUSION  09/2008   Dr. Sherwood Gambler    Family History  Problem Relation Age of Onset  . Hypertension Mother   . Cancer Paternal Grandfather        lung  . CAD Maternal Grandmother        age 68 - CABG.  . Fibromyalgia Unknown        Sister and daughter  . Allergy (severe) Unknown     Social History   Social History  . Marital status: Married    Spouse  name: N/A  . Number of children: 1  . Years of education: N/A   Occupational History  . disabled     former truck Geophysicist/field seismologist   Social History Main Topics  . Smoking status: Former Smoker    Years: 30.00    Quit date: 01/24/2009  . Smokeless tobacco: Never Used     Comment: 1/2 ppd-2ppd - quit 2010.  Marland Kitchen Alcohol use 0.0 oz/week     Comment: Rarel beer. 1-2/yr  . Drug use: No  . Sexual activity: Not on file   Other Topics Concern  . Not on file   Social History Narrative   Lives with wife and daughter.  Daughter has multiple problems, including chronic fatigue syndrome.   Does not have living will.  Would desire CPR and life support if not futile or prolonged.   The PMH, PSH, Social History, Family History, Medications, and allergies have been reviewed in [facility], and have been updated if relevant.   Review of Systems    Constitutional: Negative.   HENT: Negative.   Eyes: Positive for photophobia. Negative for visual disturbance.  Respiratory: Negative.   Cardiovascular: Negative.   Gastrointestinal: Positive for nausea and vomiting.  Genitourinary: Positive for frequency. Negative for difficulty urinating, dysuria, flank pain and penile pain.  Skin: Positive for rash.  Neurological: Positive for headaches. Negative for dizziness, tremors, seizures, syncope, facial asymmetry, speech difficulty, weakness, light-headedness and numbness.  Hematological: Negative.   All other systems reviewed and are negative.      Objective:    BP 120/70   Pulse 80   Ht 6\' 1"  (1.854 m)   Wt (!) 358 lb (162.4 kg)   SpO2 98%   BMI 47.23 kg/m    Physical Exam  Constitutional: He is oriented to person, place, and time. He appears well-developed and well-nourished. No distress.  HENT:  Head: Normocephalic and atraumatic.  Eyes: Conjunctivae are normal.  Cardiovascular: Normal rate.   Pulmonary/Chest: Effort normal.  Musculoskeletal: Normal range of motion. He exhibits no edema.  Neurological: He is alert and oriented to person, place, and time. No cranial nerve deficit. Coordination normal.  Skin: He is not diaphoretic.     Psychiatric: He has a normal mood and affect. His behavior is normal. Judgment and thought content normal.  Nursing note and vitals reviewed.         Assessment & Plan:   Psoriasis No Follow-up on file.

## 2017-02-16 LAB — COMPREHENSIVE METABOLIC PANEL
ALBUMIN: 3.8 g/dL (ref 3.6–5.1)
ALK PHOS: 114 U/L (ref 40–115)
ALT: 19 U/L (ref 9–46)
AST: 17 U/L (ref 10–40)
BILIRUBIN TOTAL: 0.4 mg/dL (ref 0.2–1.2)
BUN: 14 mg/dL (ref 7–25)
CALCIUM: 9.1 mg/dL (ref 8.6–10.3)
CO2: 27 mmol/L (ref 20–32)
Chloride: 102 mmol/L (ref 98–110)
Creat: 0.74 mg/dL (ref 0.60–1.35)
GLUCOSE: 94 mg/dL (ref 65–99)
POTASSIUM: 4.5 mmol/L (ref 3.5–5.3)
Sodium: 139 mmol/L (ref 135–146)
Total Protein: 6.3 g/dL (ref 6.1–8.1)

## 2017-02-16 LAB — PSA: PSA: 0.9 ng/mL (ref <=4.0)

## 2017-03-04 ENCOUNTER — Other Ambulatory Visit: Payer: Self-pay | Admitting: Family Medicine

## 2017-03-05 NOTE — Telephone Encounter (Signed)
Last refill 11/24/16  Last OV 02/15/17  Ok to refill?

## 2017-03-05 NOTE — Telephone Encounter (Signed)
Pt wants to verify paroxetine was sent to [facility] [address]. Advised pt paroxetine was sent earlier today to [facility] [address]

## 2017-03-11 ENCOUNTER — Other Ambulatory Visit: Payer: Self-pay | Admitting: Family Medicine

## 2017-03-19 DIAGNOSIS — M47816 Spondylosis without myelopathy or radiculopathy, lumbar region: Secondary | ICD-10-CM | POA: Diagnosis not present

## 2017-03-19 DIAGNOSIS — M545 Low back pain: Secondary | ICD-10-CM | POA: Diagnosis not present

## 2017-03-19 DIAGNOSIS — Z79891 Long term (current) use of opiate analgesic: Secondary | ICD-10-CM | POA: Diagnosis not present

## 2017-03-19 DIAGNOSIS — Z79899 Other long term (current) drug therapy: Secondary | ICD-10-CM | POA: Diagnosis not present

## 2017-03-19 DIAGNOSIS — G894 Chronic pain syndrome: Secondary | ICD-10-CM | POA: Diagnosis not present

## 2017-03-31 DIAGNOSIS — N478 Other disorders of prepuce: Secondary | ICD-10-CM | POA: Diagnosis not present

## 2017-03-31 DIAGNOSIS — B379 Candidiasis, unspecified: Secondary | ICD-10-CM | POA: Diagnosis not present

## 2017-04-16 DIAGNOSIS — M545 Low back pain: Secondary | ICD-10-CM | POA: Diagnosis not present

## 2017-04-16 DIAGNOSIS — M47816 Spondylosis without myelopathy or radiculopathy, lumbar region: Secondary | ICD-10-CM | POA: Diagnosis not present

## 2017-04-16 DIAGNOSIS — G894 Chronic pain syndrome: Secondary | ICD-10-CM | POA: Diagnosis not present

## 2017-04-22 ENCOUNTER — Other Ambulatory Visit: Payer: Self-pay

## 2017-04-22 MED ORDER — METOPROLOL SUCCINATE ER 25 MG PO TB24: 12.5000 mg | ORAL_TABLET | Freq: Every day | ORAL | 2 refills | Status: DC

## 2017-05-28 DIAGNOSIS — M47816 Spondylosis without myelopathy or radiculopathy, lumbar region: Secondary | ICD-10-CM | POA: Diagnosis not present

## 2017-05-28 DIAGNOSIS — Z79891 Long term (current) use of opiate analgesic: Secondary | ICD-10-CM | POA: Diagnosis not present

## 2017-05-28 DIAGNOSIS — M545 Low back pain: Secondary | ICD-10-CM | POA: Diagnosis not present

## 2017-05-28 DIAGNOSIS — G894 Chronic pain syndrome: Secondary | ICD-10-CM | POA: Diagnosis not present

## 2017-05-28 DIAGNOSIS — Z79899 Other long term (current) drug therapy: Secondary | ICD-10-CM | POA: Diagnosis not present

## 2017-06-09 ENCOUNTER — Other Ambulatory Visit: Payer: Self-pay | Admitting: *Deleted

## 2017-06-09 ENCOUNTER — Telehealth: Payer: Self-pay | Admitting: Family Medicine

## 2017-06-09 MED ORDER — LISINOPRIL 20 MG PO TABS: 20.0000 mg | ORAL_TABLET | Freq: Every day | ORAL | 0 refills | Status: DC

## 2017-06-09 MED ORDER — PAROXETINE HCL 20 MG PO TABS: 20.0000 mg | ORAL_TABLET | Freq: Every day | ORAL | 0 refills | Status: DC

## 2017-06-09 NOTE — Telephone Encounter (Signed)
I faxed in refill of Paxil with note that pt needs appt for future fills/thx dmf

## 2017-06-09 NOTE — Progress Notes (Signed)
Patient had change in pharmacy- Rx forwarded to new pharmacy

## 2017-06-09 NOTE — Telephone Encounter (Signed)
Lisinopril filled with note to pharmacy- patient needs to schedule appointment for annual exam before next refill. Patient should have refill of Paxil.

## 2017-06-09 NOTE — Telephone Encounter (Signed)
Copied from [place] [id number]. Topic: Quick Communication - Rx Refill/Question >> Jun 09, 2017 10:42 AM Carolyn Stare wrote: Has the patient contacted their pharmacy?    YES  (Agent: If no, request that the patient contact the pharmacy for the refill.)   Preferred Pharmacy (with phone number or street name):   Lisinopril and paroxetine  [facility]  [address]   Agent: Please be advised that RX refills may take up to 48 hours. We ask that you follow-up with your pharmacy.

## 2017-06-17 ENCOUNTER — Encounter: Payer: Self-pay | Admitting: Family Medicine

## 2017-06-17 ENCOUNTER — Ambulatory Visit (INDEPENDENT_AMBULATORY_CARE_PROVIDER_SITE_OTHER): Payer: Medicare Other | Admitting: Family Medicine

## 2017-06-17 VITALS — BP 130/76 | HR 76 | Temp 98.8°F | Ht 73.0 in | Wt 359.8 lb

## 2017-06-17 DIAGNOSIS — L409 Psoriasis, unspecified: Secondary | ICD-10-CM

## 2017-06-17 NOTE — Progress Notes (Signed)
Subjective:   Patient ID: Richard Mcguire, male    DOB: 01/13/1971, 46 y.o.   MRN: [id number]  Richard Mcguire is a pleasant 46 y.o. year old male who presents to clinic today with Other (Patient is here today for a DMV Placard.) and Psoriasis (Patient would like a referral to Dermatology.)  on 06/17/2017  HPI:  Psoriasis- Deteriorated despite using topical steroid creams. Asking to see dermatology to discuss possible oral rxs.     Current Outpatient Medications on File Prior to Visit  Medication Sig Dispense Refill  . gabapentin (NEURONTIN) 300 MG capsule Take 300 mg by mouth 2 (two) times daily. 300mg  am prn, 300mg  every night    . lisinopril (PRINIVIL,ZESTRIL) 20 MG tablet Take 1 tablet (20 mg total) by mouth daily. 90 tablet 0  . metoprolol succinate (TOPROL-XL) 25 MG 24 hr tablet Take 0.5 tablets (12.5 mg total) by mouth daily. 30 tablet 2  . morphine (MSIR) 30 MG tablet Take 30 mg by mouth 2 (two) times daily.  0  . Multiple Vitamin (MULTIVITAMIN) tablet Take 1 tablet by mouth daily.    . naproxen sodium (ANAPROX) 220 MG tablet Take 2 by mouth twice a day    . PARoxetine (PAXIL) 20 MG tablet Take 1 tablet (20 mg total) by mouth daily. 90 tablet 0  . SUMAtriptan (IMITREX) 50 MG tablet TAKE 1 TABLET BY MOUTH EVERY 2 HOURS AS NEEDED FOR MIGRAINE. MAY REPEAT IN 2 HOURS IF HEADACHE PERSISTS OR RECURS 10 tablet 5  . tiZANidine (ZANAFLEX) 4 MG tablet Take 1 tablet by mouth 2 (two) times daily.  0  . Pseudoephedrine-APAP-DM (DAYQUIL PO) Take 2 capsules by mouth as needed.      No current facility-administered medications on file prior to visit.     No Known Allergies  Past Medical History:  Diagnosis Date  . Anxiety   . Chronic fatigue   . Chronic pain   . Dyslipidemia   . HTN (hypertension)   . Kidney stone   . Lumbar disc disease with radiculopathy    sees Dr. Rita Ohara  . Major depressive disorder, recurrent episode, moderate ([place])   . Migraine headache   .  Morbid obesity ([place])   . Tachycardia    a. 07/2012 - placed on BB. CT angio neg.  Marland Kitchen Urethral stricture    Was told he would need pediatric cath if ever had urinary cath    Past Surgical History:  Procedure Laterality Date  . CARDIAC CATHETERIZATION  05/2013   minimal coronary plaque with normal LV fxn (hochrein)  . fatty tumor removal    . LEFT HEART CATHETERIZATION WITH CORONARY ANGIOGRAM N/A 06/13/2013   Procedure: LEFT HEART CATHETERIZATION WITH CORONARY ANGIOGRAM;  Surgeon: Minus Breeding, MD;  Location: [facility] CATH LAB;  Service: Cardiovascular;  Laterality: N/A;  . LUMBAR FUSION  09/2008   Dr. Sherwood Gambler    Family History  Problem Relation Age of Onset  . Hypertension Mother   . Cancer Paternal Grandfather        lung  . CAD Maternal Grandmother        age 99 - CABG.  . Fibromyalgia Unknown        Sister and daughter  . Allergy (severe) Unknown     Social History   Socioeconomic History  . Marital status: Married    Spouse name: Not on file  . Number of children: 1  . Years of education: Not on file  . Highest education level:  Not on file  Social Needs  . Financial resource strain: Not on file  . Food insecurity - worry: Not on file  . Food insecurity - inability: Not on file  . Transportation needs - medical: Not on file  . Transportation needs - non-medical: Not on file  Occupational History  . Occupation: disabled    Comment: former truck Geophysicist/field seismologist  Tobacco Use  . Smoking status: Former Smoker    Years: 30.00    Last attempt to quit: 01/24/2009    Years since quitting: 8.4  . Smokeless tobacco: Never Used  . Tobacco comment: 1/2 ppd-2ppd - quit 2010.  Substance and Sexual Activity  . Alcohol use: Yes    Alcohol/week: 0.0 oz    Comment: Rarel beer. 1-2/yr  . Drug use: No  . Sexual activity: Not on file  Other Topics Concern  . Not on file  Social History Narrative   Lives with wife and daughter.  Daughter has multiple problems, including chronic fatigue  syndrome.   Does not have living will.  Would desire CPR and life support if not futile or prolonged.   The PMH, PSH, Social History, Family History, Medications, and allergies have been reviewed in [facility], and have been updated if relevant.   Review of Systems  Skin: Positive for rash.  All other systems reviewed and are negative.      Objective:    BP 130/76 (BP Location: Left Arm, Patient Position: Sitting, Cuff Size: Large)   Pulse 76   Temp 98.8 F (37.1 C) (Oral)   Ht 6\' 1"  (1.854 m)   Wt (!) 359 lb 12.8 oz (163.2 kg)   SpO2 95%   BMI 47.47 kg/m    Physical Exam  Constitutional: He is oriented to person, place, and time. He appears well-developed. No distress.  HENT:  Head: Normocephalic and atraumatic.  Eyes: Conjunctivae are normal.  Cardiovascular: Normal rate.  Pulmonary/Chest: Effort normal.  Musculoskeletal: Normal range of motion.  Neurological: He is alert and oriented to person, place, and time. No cranial nerve deficit.  Skin: He is not diaphoretic.  + plaques on scalp, forehead, left leg, right arm  Psychiatric: He has a normal mood and affect. His behavior is normal. Judgment and thought content normal.  Nursing note and vitals reviewed.         Assessment & Plan:   Psoriasis No Follow-up on file.

## 2017-06-17 NOTE — Patient Instructions (Signed)
Great to see you. Happy Holidays!  We are referring you to a dermatologist. Please stop by to see Vaughan Basta on your way out.

## 2017-06-17 NOTE — Assessment & Plan Note (Signed)
Deteriorated. Dermatology referral placed.

## 2017-06-25 DIAGNOSIS — Z79899 Other long term (current) drug therapy: Secondary | ICD-10-CM | POA: Diagnosis not present

## 2017-06-25 DIAGNOSIS — G894 Chronic pain syndrome: Secondary | ICD-10-CM | POA: Diagnosis not present

## 2017-06-25 DIAGNOSIS — M545 Low back pain: Secondary | ICD-10-CM | POA: Diagnosis not present

## 2017-06-25 DIAGNOSIS — Z79891 Long term (current) use of opiate analgesic: Secondary | ICD-10-CM | POA: Diagnosis not present

## 2017-06-25 DIAGNOSIS — M47816 Spondylosis without myelopathy or radiculopathy, lumbar region: Secondary | ICD-10-CM | POA: Diagnosis not present

## 2017-07-23 DIAGNOSIS — M545 Low back pain: Secondary | ICD-10-CM | POA: Diagnosis not present

## 2017-07-23 DIAGNOSIS — G894 Chronic pain syndrome: Secondary | ICD-10-CM | POA: Diagnosis not present

## 2017-07-23 DIAGNOSIS — M47816 Spondylosis without myelopathy or radiculopathy, lumbar region: Secondary | ICD-10-CM | POA: Diagnosis not present

## 2017-08-20 ENCOUNTER — Other Ambulatory Visit: Payer: Self-pay | Admitting: Family Medicine

## 2017-08-20 DIAGNOSIS — Z79891 Long term (current) use of opiate analgesic: Secondary | ICD-10-CM | POA: Diagnosis not present

## 2017-08-20 DIAGNOSIS — G894 Chronic pain syndrome: Secondary | ICD-10-CM | POA: Diagnosis not present

## 2017-08-20 DIAGNOSIS — M545 Low back pain: Secondary | ICD-10-CM | POA: Diagnosis not present

## 2017-08-20 DIAGNOSIS — Z79899 Other long term (current) drug therapy: Secondary | ICD-10-CM | POA: Diagnosis not present

## 2017-08-20 DIAGNOSIS — M47816 Spondylosis without myelopathy or radiculopathy, lumbar region: Secondary | ICD-10-CM | POA: Diagnosis not present

## 2017-08-26 ENCOUNTER — Ambulatory Visit: Payer: Medicare Other | Admitting: Family Medicine

## 2017-09-10 DIAGNOSIS — M4726 Other spondylosis with radiculopathy, lumbar region: Secondary | ICD-10-CM | POA: Diagnosis not present

## 2017-09-10 DIAGNOSIS — M48062 Spinal stenosis, lumbar region with neurogenic claudication: Secondary | ICD-10-CM | POA: Diagnosis not present

## 2017-09-10 DIAGNOSIS — M5440 Lumbago with sciatica, unspecified side: Secondary | ICD-10-CM | POA: Diagnosis not present

## 2017-09-10 DIAGNOSIS — M546 Pain in thoracic spine: Secondary | ICD-10-CM | POA: Diagnosis not present

## 2017-09-10 DIAGNOSIS — M5416 Radiculopathy, lumbar region: Secondary | ICD-10-CM | POA: Diagnosis not present

## 2017-09-17 DIAGNOSIS — M47816 Spondylosis without myelopathy or radiculopathy, lumbar region: Secondary | ICD-10-CM | POA: Diagnosis not present

## 2017-09-17 DIAGNOSIS — M545 Low back pain: Secondary | ICD-10-CM | POA: Diagnosis not present

## 2017-09-17 DIAGNOSIS — G894 Chronic pain syndrome: Secondary | ICD-10-CM | POA: Diagnosis not present

## 2017-10-15 DIAGNOSIS — M47816 Spondylosis without myelopathy or radiculopathy, lumbar region: Secondary | ICD-10-CM | POA: Diagnosis not present

## 2017-10-15 DIAGNOSIS — M545 Low back pain: Secondary | ICD-10-CM | POA: Diagnosis not present

## 2017-10-15 DIAGNOSIS — G894 Chronic pain syndrome: Secondary | ICD-10-CM | POA: Diagnosis not present

## 2017-10-15 DIAGNOSIS — Z79891 Long term (current) use of opiate analgesic: Secondary | ICD-10-CM | POA: Diagnosis not present

## 2017-10-15 DIAGNOSIS — Z79899 Other long term (current) drug therapy: Secondary | ICD-10-CM | POA: Diagnosis not present

## 2017-11-19 DIAGNOSIS — G894 Chronic pain syndrome: Secondary | ICD-10-CM | POA: Diagnosis not present

## 2017-11-19 DIAGNOSIS — M47816 Spondylosis without myelopathy or radiculopathy, lumbar region: Secondary | ICD-10-CM | POA: Diagnosis not present

## 2017-11-19 DIAGNOSIS — M545 Low back pain: Secondary | ICD-10-CM | POA: Diagnosis not present

## 2017-12-03 ENCOUNTER — Other Ambulatory Visit: Payer: Self-pay | Admitting: Family Medicine

## 2017-12-09 ENCOUNTER — Telehealth: Payer: Self-pay | Admitting: Family Medicine

## 2017-12-09 NOTE — Telephone Encounter (Unsigned)
Copied from [place] [id number]. Topic: Quick Communication - Rx Refill/Question >> Dec 09, 2017  5:42 PM Neva Seat wrote: lisinopril (PRINIVIL,ZESTRIL) 20 MG tablet PARoxetine (PAXIL) 20 MG tablet  Verbal authorization  [facility] -  [id number] Ref  [id number]

## 2017-12-10 ENCOUNTER — Other Ambulatory Visit: Payer: Self-pay

## 2017-12-10 NOTE — Telephone Encounter (Signed)
Spoke with [facility] and confirmed Rx refills. TLG

## 2017-12-15 ENCOUNTER — Other Ambulatory Visit: Payer: Self-pay

## 2017-12-15 ENCOUNTER — Encounter: Payer: Self-pay | Admitting: Behavioral Health

## 2017-12-15 ENCOUNTER — Ambulatory Visit (INDEPENDENT_AMBULATORY_CARE_PROVIDER_SITE_OTHER): Payer: Medicare Other | Admitting: Behavioral Health

## 2017-12-15 VITALS — BP 130/78 | HR 82 | Ht 73.0 in | Wt 357.8 lb

## 2017-12-15 DIAGNOSIS — Z0000 Encounter for general adult medical examination without abnormal findings: Secondary | ICD-10-CM

## 2017-12-15 MED ORDER — LISINOPRIL 20 MG PO TABS: 20.0000 mg | ORAL_TABLET | Freq: Every day | ORAL | 0 refills | Status: DC

## 2017-12-15 MED ORDER — METOPROLOL SUCCINATE ER 25 MG PO TB24: 12.5000 mg | ORAL_TABLET | Freq: Every day | ORAL | 1 refills | Status: DC

## 2017-12-15 MED ORDER — PAROXETINE HCL 20 MG PO TABS: 20.0000 mg | ORAL_TABLET | Freq: Every day | ORAL | 0 refills | Status: DC

## 2017-12-15 NOTE — Progress Notes (Addendum)
Subjective:   Richard Mcguire is a 47 y.o. male who presents for Medicare Annual/Subsequent preventive examination.  Review of Systems:  No ROS.  Medicare Wellness Visit. Additional risk factors are reflected in the social history. Cardiac Risk Factors include: advanced age (>39men, >35 women);dyslipidemia;hypertension;obesity (BMI >30kg/m2);sedentary lifestyle;male gender Sleep patterns:  Sleeps 6-8 hrs.  Home Safety/Smoke Alarms: Feels safe in home. Smoke alarms in place.  Living environment; residence and Firearm Safety: 1 story. Lives with wife and 3 grandchildren and 3 dogs.  Male:   CCS- na    PSA-  Lab Results  Component Value Date   PSA 0.9 02/15/2017   PSA 1.49 01/04/2014       Objective:    Vitals: BP 130/78 (BP Location: Left Arm, Patient Position: Sitting, Cuff Size: Large)   Pulse 82   Ht 6\' 1"  (1.854 m)   Wt (!) 357 lb 12.8 oz (162.3 kg)   SpO2 97%   BMI 47.21 kg/m   Body mass index is 47.21 kg/m.  Advanced Directives 12/15/2017 08/06/2014 06/13/2013  Does Patient Have a Medical Advance Directive? No No Patient does not have advance directive  Would patient like information on creating a medical advance directive? Yes (MAU/Ambulatory/Procedural Areas - Information given) No - patient declined information -    Tobacco Social History   Tobacco Use  Smoking Status Former Smoker  . Years: 30.00  . Last attempt to quit: 01/24/2009  . Years since quitting: 8.8  Smokeless Tobacco Never Used  Tobacco Comment   1/2 ppd-2ppd - quit 2010.     Counseling given: Not Answered Comment: 1/2 ppd-2ppd - quit 2010.   Clinical Intake:     Pain : (Chronic pain. No new pains)                 Past Medical History:  Diagnosis Date  . Anxiety   . Chronic fatigue   . Chronic pain   . Dyslipidemia   . HTN (hypertension)   . Kidney stone   . Lumbar disc disease with radiculopathy    sees Dr. Rita Ohara  . Major depressive disorder, recurrent episode,  moderate ([place])   . Migraine headache   . Morbid obesity ([place])   . Tachycardia    a. 07/2012 - placed on BB. CT angio neg.  Marland Kitchen Urethral stricture    Was told he would need pediatric cath if ever had urinary cath   Past Surgical History:  Procedure Laterality Date  . CARDIAC CATHETERIZATION  05/2013   minimal coronary plaque with normal LV fxn (hochrein)  . fatty tumor removal    . LEFT HEART CATHETERIZATION WITH CORONARY ANGIOGRAM N/A 06/13/2013   Procedure: LEFT HEART CATHETERIZATION WITH CORONARY ANGIOGRAM;  Surgeon: Minus Breeding, MD;  Location: [facility] CATH LAB;  Service: Cardiovascular;  Laterality: N/A;  . LUMBAR FUSION  09/2008   Dr. Sherwood Gambler   Family History  Problem Relation Age of Onset  . Hypertension Mother   . Cancer Paternal Grandfather        lung  . CAD Maternal Grandmother        age 43 - CABG.  . Fibromyalgia Unknown        Sister and daughter  . Allergy (severe) Unknown    Social History   Socioeconomic History  . Marital status: Married    Spouse name: Not on file  . Number of children: 1  . Years of education: Not on file  . Highest education level: Not on file  Occupational History  . 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 Financial resource strain: Not on file  . Food insecurity:    Worry: Not on file    Inability: Not on file  . Transportation needs:    Medical: Not on file    Non-medical: Not on file  Tobacco Use  . Smoking status: Former Smoker    Years: 30.00    Last attempt to quit: 01/24/2009    Years since quitting: 8.8  . Smokeless tobacco: Never Used  . Tobacco comment: 1/2 ppd-2ppd - quit 2010.  Substance and Sexual Activity  . Alcohol use: Yes    Alcohol/week: 0.0 oz    Comment: Rarel beer. 1-2/yr  . Drug use: No  . Sexual activity: Not on file  Lifestyle  . Physical activity:    Days per week: Not on file    Minutes per session: Not on file  . Stress: Not on file  Relationships  . Social  connections:    Talks on phone: Not on file    Gets together: Not on file    Attends religious service: Not on file    Active member of club or organization: Not on file    Attends meetings of clubs or organizations: Not on file    Relationship status: Not on file  Other Topics Concern  . Not on file  Social History Narrative   Lives with wife and daughter.  Daughter has multiple problems, including chronic fatigue syndrome.   Does not have living will.  Would desire CPR and life support if not futile or prolonged.    Outpatient Encounter Medications as of 12/15/2017  Medication Sig  . gabapentin (NEURONTIN) 300 MG capsule Take 300 mg by mouth 2 (two) times daily. 300mg  am prn, 300mg  every night  . morphine (MSIR) 30 MG tablet Take 30 mg by mouth 2 (two) times daily.  . Multiple Vitamin (MULTIVITAMIN) tablet Take 1 tablet by mouth daily.  . naproxen sodium (ANAPROX) 220 MG tablet Take 2 by mouth twice a day  . Pseudoephedrine-APAP-DM (DAYQUIL PO) Take 2 capsules by mouth as needed.   . SUMAtriptan (IMITREX) 50 MG tablet TAKE 1 TABLET BY MOUTH EVERY 2 HOURS AS NEEDED FOR MIGRAINE. MAY REPEAT IN 2 HOURS IF HEADACHE PERSISTS OR RECURS  . tiZANidine (ZANAFLEX) 4 MG tablet Take 1 tablet by mouth 2 (two) times daily.  . [DISCONTINUED] lisinopril (PRINIVIL,ZESTRIL) 20 MG tablet TAKE 1 TABLET BY MOUTH EVERY DAY  . [DISCONTINUED] metoprolol succinate (TOPROL-XL) 25 MG 24 hr tablet TAKE 0.5 TABLETS (12.5 MG TOTAL) BY MOUTH DAILY.  . [DISCONTINUED] PARoxetine (PAXIL) 20 MG tablet TAKE 1 TABLET BY MOUTH EVERY DAY   No facility-administered encounter medications on file as of 12/15/2017.     Activities of Daily Living In your present state of health, do you have any difficulty performing the following activities: 12/15/2017 06/17/2017  Hearing? N N  Vision? N N  Difficulty concentrating or making decisions? N N  Walking or climbing stairs? Y Y  Comment pain in knees -  Dressing or bathing? N N    Doing errands, shopping? N Y  Conservation officer, nature and eating ? N -  Using the Toilet? N -  In the past six months, have you accidently leaked urine? N -  Do you have problems with loss of bowel control? N -  Managing your Medications? N -  Managing your Finances? N -  Housekeeping or  managing your Housekeeping? N -  Some recent data might be hidden    Patient Care Team: Lucille Passy, MD as PCP - Kathee Polite, MD as Consulting Physician (Neurosurgery) Clance, Armando Reichert, MD as Consulting Physician (Pulmonary Disease) Minus Breeding, MD as Consulting Physician (Cardiology)   Assessment:   This is a routine wellness examination for [place]. Physical assessment deferred to PCP.  Exercise Activities and Dietary recommendations Current Exercise Habits: The patient does not participate in regular exercise at present, Exercise limited by: Other - see comments Diet (meal preparation, eat out, water intake, caffeinated beverages, dairy products, fruits and vegetables): 24 hour recall Breakfast: 1 toast, 2 eggs, coffee. 1/2 bottle of water. Lunch:  skips Dinner:  2 eggs and grits Pt states he will not eat vegetables, but knows he should. Goals    . DIET - INCREASE WATER INTAKE       Fall Risk Fall Risk  12/15/2017 04/25/2015 01/04/2014  Falls in the past year? No No No     Depression Screen PHQ 2/9 Scores 12/15/2017 06/17/2017 02/15/2017 04/25/2015  PHQ - 2 Score 6 0 4 2  PHQ- 9 Score 24 0 11 5    Cognitive Function Ad8 score reviewed for issues:  Issues making decisions:no  Less interest in hobbies / activities:no  Repeats questions, stories (family complaining):no  Trouble using ordinary gadgets (microwave, computer, phone):no  Forgets the month or year: no  Mismanaging finances: no  Remembering appts:no  Daily problems with thinking and/or memory:no Ad8 score is=0         Immunization History  Administered Date(s) Administered  . Influenza Whole  04/23/2009  . Influenza, Quadrivalent, Recombinant, Inj, Pf 04/12/2017  . Influenza, Seasonal, Injecte, Preservative Fre 07/15/2012, 06/16/2013, 08/21/2014, 04/25/2015, 04/01/2016  . Influenza,inj,Quad PF,6+ Mos 06/16/2013, 08/21/2014, 04/25/2015, 04/01/2016  . Tdap 06/16/2013     Screening Tests Health Maintenance  Topic Date Due  . HIV Screening  10/05/1986  . INFLUENZA VACCINE  02/10/2018  . TETANUS/TDAP  06/17/2023      Plan:    Please schedule your next medicare wellness visit with me in 1 yr.  Eat heart healthy diet (full of fruits, vegetables, whole grains, lean protein, water--limit salt, fat, and sugar intake) and increase physical activity as tolerated.  Continue doing brain stimulating activities (puzzles, reading, adult coloring books, staying active) to keep memory sharp.     I have personally reviewed and noted the following in the patient's chart:   . Medical and social history . Use of alcohol, tobacco or illicit drugs  . Current medications and supplements . Functional ability and status . Nutritional status . Physical activity . Advanced directives . List of other physicians . Hospitalizations, surgeries, and ER visits in previous 12 months . Vitals . Screenings to include cognitive, depression, and falls . Referrals and appointments  In addition, I have reviewed and discussed with patient certain preventive protocols, quality metrics, and best practice recommendations. A written personalized care plan for preventive services as well as general preventive health recommendations were provided to patient.     Shela Nevin, [place]  12/15/2017  Reviewed and agree with above  Luetta Nutting, DO

## 2017-12-15 NOTE — Patient Instructions (Signed)
Please schedule your next medicare wellness visit with me in 1 yr.  Eat heart healthy diet (full of fruits, vegetables, whole grains, lean protein, water--limit salt, fat, and sugar intake) and increase physical activity as tolerated.  Continue doing brain stimulating activities (puzzles, reading, adult coloring books, staying active) to keep memory sharp.    Richard Mcguire , Thank you for taking time to come for your Medicare Wellness Visit. I appreciate your ongoing commitment to your health goals. Please review the following plan we discussed and let me know if I can assist you in the future.   These are the goals we discussed: Goals    . DIET - INCREASE WATER INTAKE       This is a list of the screening recommended for you and due dates:  Health Maintenance  Topic Date Due  . HIV Screening  10/05/1986  . Flu Shot  02/10/2018  . Tetanus Vaccine  06/17/2023    Health Maintenance, Male A healthy lifestyle and preventive care is important for your health and wellness. Ask your health care provider about what schedule of regular examinations is right for you. What should I know about weight and diet? Eat a Healthy Diet  Eat plenty of vegetables, fruits, whole grains, low-fat dairy products, and lean protein.  Do not eat a lot of foods high in solid fats, added sugars, or salt.  Maintain a Healthy Weight Regular exercise can help you achieve or maintain a healthy weight. You should:  Do at least 150 minutes of exercise each week. The exercise should increase your heart rate and make you sweat (moderate-intensity exercise).  Do strength-training exercises at least twice a week.  Watch Your Levels of Cholesterol and Blood Lipids  Have your blood tested for lipids and cholesterol every 5 years starting at 46 years of age. If you are at high risk for heart disease, you should start having your blood tested when you are 46 years old. You may need to have your cholesterol levels  checked more often if: ? Your lipid or cholesterol levels are high. ? You are older than 46 years of age. ? You are at high risk for heart disease.  What should I know about cancer screening? Many types of cancers can be detected early and may often be prevented. Lung Cancer  You should be screened every year for lung cancer if: ? You are a current smoker who has smoked for at least 30 years. ? You are a former smoker who has quit within the past 15 years.  Talk to your health care provider about your screening options, when you should start screening, and how often you should be screened.  Colorectal Cancer  Routine colorectal cancer screening usually begins at 46 years of age and should be repeated every 5-10 years until you are 46 years old. You may need to be screened more often if early forms of precancerous polyps or small growths are found. Your health care provider may recommend screening at an earlier age if you have risk factors for colon cancer.  Your health care provider may recommend using home test kits to check for hidden blood in the stool.  A small camera at the end of a tube can be used to examine your colon (sigmoidoscopy or colonoscopy). This checks for the earliest forms of colorectal cancer.  Prostate and Testicular Cancer  Depending on your age and overall health, your health care provider may do certain tests to screen for prostate  and testicular cancer.  Talk to your health care provider about any symptoms or concerns you have about testicular or prostate cancer.  Skin Cancer  Check your skin from head to toe regularly.  Tell your health care provider about any new moles or changes in moles, especially if: ? There is a change in a mole's size, shape, or color. ? You have a mole that is larger than a pencil eraser.  Always use sunscreen. Apply sunscreen liberally and repeat throughout the day.  Protect yourself by wearing long sleeves, pants, a  wide-brimmed hat, and sunglasses when outside.  What should I know about heart disease, diabetes, and high blood pressure?  If you are 86-12 years of age, have your blood pressure checked every 3-5 years. If you are 62 years of age or older, have your blood pressure checked every year. You should have your blood pressure measured twice-once when you are at a hospital or clinic, and once when you are not at a hospital or clinic. Record the average of the two measurements. To check your blood pressure when you are not at a hospital or clinic, you can use: ? An automated blood pressure machine at a pharmacy. ? A home blood pressure monitor.  Talk to your health care provider about your target blood pressure.  If you are between 35-33 years old, ask your health care provider if you should take aspirin to prevent heart disease.  Have regular diabetes screenings by checking your fasting blood sugar level. ? If you are at a normal weight and have a low risk for diabetes, have this test once every three years after the age of 39. ? If you are overweight and have a high risk for diabetes, consider being tested at a younger age or more often.  A one-time screening for abdominal aortic aneurysm (AAA) by ultrasound is recommended for men aged 47-75 years who are current or former smokers. What should I know about preventing infection? Hepatitis B If you have a higher risk for hepatitis B, you should be screened for this virus. Talk with your health care provider to find out if you are at risk for hepatitis B infection. Hepatitis C Blood testing is recommended for:  Everyone born from [birth year] through 1964.  Anyone with known risk factors for hepatitis C.  Sexually Transmitted Diseases (STDs)  You should be screened each year for STDs including gonorrhea and chlamydia if: ? You are sexually active and are younger than 46 years of age. ? You are older than 46 years of age and your health care provider  tells you that you are at risk for this type of infection. ? Your sexual activity has changed since you were last screened and you are at an increased risk for chlamydia or gonorrhea. Ask your health care provider if you are at risk.  Talk with your health care provider about whether you are at high risk of being infected with HIV. Your health care provider may recommend a prescription medicine to help prevent HIV infection.  What else can I do?  Schedule regular health, dental, and eye exams.  Stay current with your vaccines (immunizations).  Do not use any tobacco products, such as cigarettes, chewing tobacco, and e-cigarettes. If you need help quitting, ask your health care provider.  Limit alcohol intake to no more than 2 drinks per day. One drink equals 12 ounces of beer, 5 ounces of wine, or 1 ounces of hard liquor.  Do not use  street drugs.  Do not share needles.  Ask your health care provider for help if you need support or information about quitting drugs.  Tell your health care provider if you often feel depressed.  Tell your health care provider if you have ever been abused or do not feel safe at home. This information is not intended to replace advice given to you by your health care provider. Make sure you discuss any questions you have with your health care provider. Document Released: 12/26/2007 Document Revised: 02/26/2016 Document Reviewed: 04/02/2015 Elsevier Interactive Patient Education  [employer].

## 2017-12-17 DIAGNOSIS — M545 Low back pain: Secondary | ICD-10-CM | POA: Diagnosis not present

## 2017-12-17 DIAGNOSIS — G894 Chronic pain syndrome: Secondary | ICD-10-CM | POA: Diagnosis not present

## 2017-12-17 DIAGNOSIS — M47816 Spondylosis without myelopathy or radiculopathy, lumbar region: Secondary | ICD-10-CM | POA: Diagnosis not present

## 2018-01-11 ENCOUNTER — Encounter: Payer: Self-pay | Admitting: Family Medicine

## 2018-01-17 ENCOUNTER — Encounter: Payer: Self-pay | Admitting: Family Medicine

## 2018-01-17 ENCOUNTER — Ambulatory Visit (INDEPENDENT_AMBULATORY_CARE_PROVIDER_SITE_OTHER): Payer: Medicare Other | Admitting: Family Medicine

## 2018-01-17 VITALS — BP 124/86 | HR 94 | Temp 98.9°F | Ht 73.0 in | Wt 355.8 lb

## 2018-01-17 DIAGNOSIS — F321 Major depressive disorder, single episode, moderate: Secondary | ICD-10-CM | POA: Diagnosis not present

## 2018-01-17 DIAGNOSIS — G894 Chronic pain syndrome: Secondary | ICD-10-CM

## 2018-01-17 NOTE — Assessment & Plan Note (Signed)
No changes made to current dose of paxil today but he will keep me updated if his symptoms do not improve once he knows the status of his employment/disability.

## 2018-01-17 NOTE — Assessment & Plan Note (Signed)
>  15 minutes spent in face to face time with patient, >50% spent in counselling or coordination of care. Explained to Richard Mcguire that there is nothing that I treat him for that would qualify him for permanent disability.  I advised him to contact the social security office again to discuss his options. The patient indicates understanding of these issues and agrees with the plan.

## 2018-01-17 NOTE — Progress Notes (Signed)
Subjective:   Patient ID: Richard Mcguire, male    DOB: 1971-02-08, 47 y.o.   MRN: [id number]  Richard Mcguire is a pleasant 47 y.o. year old male who presents to clinic today with Follow-up (Patient is here today to be seen to discuss the fact that he is fighting to keep his disability.  He is also seeing doc at [facility] this week as well.   He was last seen there on 4.5.49 and UDS WNL and he is prescribed Morphine ER 30mg  bid through them. He agrees to get lab draw for HIV.  He had coffee with cream and sugar this am only.  I had patient fill out PHQ-GAD.)  on 01/17/2018  HPI:  Patient is here today to be seen to discuss the fact that he is fighting to keep his disability. He is also seeing doc at [facility] this week as well. He was last seen there on 4.5.49 and UDS WNL and he is prescribed Morphine ER 30mg  bid through them. He agrees to get lab draw for HIV. He had coffee with cream and sugar this am only.  He is concerned because his neurosurgeon told him that since his lumbar fusion was successful, he should be able to work.  Richard Mcguire feels he cannot work because he still has significant pain requiring him to be seen at pain management and that pain rxs make him too sleepy to work.  Depression/anxiety- still taking paxil 20 mg daily.  Depression screen is high today but he feels it is situational due to possibly losing disability. Wife is currently unemployed as well. Current Outpatient Medications on File Prior to Visit  Medication Sig Dispense Refill  . gabapentin (NEURONTIN) 300 MG capsule Take 300 mg by mouth 2 (two) times daily. 300mg  am prn, 300mg  every night    . lisinopril (PRINIVIL,ZESTRIL) 20 MG tablet Take 1 tablet (20 mg total) by mouth daily. 90 tablet 0  . metoprolol succinate (TOPROL-XL) 25 MG 24 hr tablet Take 0.5 tablets (12.5 mg total) by mouth daily. 30 tablet 1  . morphine (MSIR) 30 MG tablet Take 30 mg by mouth 2 (two) times  daily.  0  . Multiple Vitamin (MULTIVITAMIN) tablet Take 1 tablet by mouth daily.    . naproxen sodium (ANAPROX) 220 MG tablet Take 2 by mouth twice a day    . PARoxetine (PAXIL) 20 MG tablet Take 1 tablet (20 mg total) by mouth daily. 90 tablet 0  . Pseudoephedrine-APAP-DM (DAYQUIL PO) Take 2 capsules by mouth as needed.     . SUMAtriptan (IMITREX) 50 MG tablet TAKE 1 TABLET BY MOUTH EVERY 2 HOURS AS NEEDED FOR MIGRAINE. MAY REPEAT IN 2 HOURS IF HEADACHE PERSISTS OR RECURS 10 tablet 5  . tiZANidine (ZANAFLEX) 4 MG tablet Take 1 tablet by mouth 2 (two) times daily.  0   No current facility-administered medications on file prior to visit.     No Known Allergies  Past Medical History:  Diagnosis Date  . Anxiety   . Chronic fatigue   . Chronic pain   . Dyslipidemia   . HTN (hypertension)   . Kidney stone   . Lumbar disc disease with radiculopathy    sees Dr. Rita Ohara  . Major depressive disorder, recurrent episode, moderate ([place])   . Migraine headache   . Morbid obesity ([place])   . Tachycardia    a. 07/2012 - placed on BB. CT angio neg.  Marland Kitchen Urethral stricture  Was told he would need pediatric cath if ever had urinary cath    Past Surgical History:  Procedure Laterality Date  . CARDIAC CATHETERIZATION  05/2013   minimal coronary plaque with normal LV fxn (hochrein)  . fatty tumor removal    . LEFT HEART CATHETERIZATION WITH CORONARY ANGIOGRAM N/A 06/13/2013   Procedure: LEFT HEART CATHETERIZATION WITH CORONARY ANGIOGRAM;  Surgeon: Minus Breeding, MD;  Location: [facility] CATH LAB;  Service: Cardiovascular;  Laterality: N/A;  . LUMBAR FUSION  09/2008   Dr. Sherwood Gambler    Family History  Problem Relation Age of Onset  . Hypertension Mother   . Cancer Paternal Grandfather        lung  . CAD Maternal Grandmother        age 56 - CABG.  . Fibromyalgia Unknown        Sister and daughter  . Allergy (severe) Unknown     Social History   Socioeconomic History  . Marital status: Married      Spouse name: Not on file  . Number of children: 1  . Years of education: Not on file  . Highest education level: Not on file  Occupational History  . 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 Financial resource strain: Not on file  . Food insecurity:    Worry: Not on file    Inability: Not on file  . Transportation needs:    Medical: Not on file    Non-medical: Not on file  Tobacco Use  . Smoking status: Former Smoker    Years: 30.00    Last attempt to quit: 01/24/2009    Years since quitting: 8.9  . Smokeless tobacco: Never Used  . Tobacco comment: 1/2 ppd-2ppd - quit 2010.  Substance and Sexual Activity  . Alcohol use: Yes    Alcohol/week: 0.0 oz    Comment: Rarel beer. 1-2/yr  . Drug use: No  . Sexual activity: Not on file  Lifestyle  . Physical activity:    Days per week: Not on file    Minutes per session: Not on file  . Stress: Not on file  Relationships  . Social connections:    Talks on phone: Not on file    Gets together: Not on file    Attends religious service: Not on file    Active member of club or organization: Not on file    Attends meetings of clubs or organizations: Not on file    Relationship status: Not on file  . Intimate partner violence:    Fear of current or ex partner: Not on file    Emotionally abused: Not on file    Physically abused: Not on file    Forced sexual activity: Not on file  Other Topics Concern  . Not on file  Social History Narrative   Lives with wife and daughter.  Daughter has multiple problems, including chronic fatigue syndrome.   Does not have living will.  Would desire CPR and life support if not futile or prolonged.   The PMH, PSH, Social History, Family History, Medications, and allergies have been reviewed in [facility], and have been updated if relevant.  Review of Systems  Musculoskeletal: Positive for arthralgias and back pain.  Psychiatric/Behavioral: Positive for dysphoric mood.  Negative for agitation, behavioral problems, confusion, decreased concentration, hallucinations, self-injury, sleep disturbance and suicidal ideas. The patient is nervous/anxious. The patient is not hyperactive.   All other systems reviewed and are negative.  Objective:    BP 124/86 (BP Location: Left Arm, Patient Position: Sitting, Cuff Size: Large)   Pulse 94   Temp 98.9 F (37.2 C) (Oral)   Ht 6\' 1"  (1.854 m)   Wt (!) 355 lb 12.8 oz (161.4 kg)   SpO2 96%   BMI 46.94 kg/m    Physical Exam  Constitutional: He is oriented to person, place, and time. He appears well-developed and well-nourished. No distress.  HENT:  Head: Normocephalic and atraumatic.  Cardiovascular: Normal rate.  Pulmonary/Chest: Effort normal.  Neurological: He is alert and oriented to person, place, and time.  Skin: Skin is warm and dry. He is not diaphoretic.  Nursing note and vitals reviewed.         Assessment & Plan:   Current moderate episode of major depressive disorder without prior episode (HCC) No follow-ups on file.

## 2018-01-17 NOTE — Patient Instructions (Signed)
Great to see you. Please keep me updated. 

## 2018-01-19 DIAGNOSIS — G894 Chronic pain syndrome: Secondary | ICD-10-CM | POA: Diagnosis not present

## 2018-01-19 DIAGNOSIS — Z79899 Other long term (current) drug therapy: Secondary | ICD-10-CM | POA: Diagnosis not present

## 2018-01-19 DIAGNOSIS — M47816 Spondylosis without myelopathy or radiculopathy, lumbar region: Secondary | ICD-10-CM | POA: Diagnosis not present

## 2018-01-19 DIAGNOSIS — Z79891 Long term (current) use of opiate analgesic: Secondary | ICD-10-CM | POA: Diagnosis not present

## 2018-01-27 ENCOUNTER — Telehealth: Payer: Self-pay

## 2018-01-27 DIAGNOSIS — L814 Other melanin hyperpigmentation: Secondary | ICD-10-CM | POA: Diagnosis not present

## 2018-01-27 DIAGNOSIS — L400 Psoriasis vulgaris: Secondary | ICD-10-CM | POA: Diagnosis not present

## 2018-01-27 DIAGNOSIS — D225 Melanocytic nevi of trunk: Secondary | ICD-10-CM | POA: Diagnosis not present

## 2018-01-27 DIAGNOSIS — D1801 Hemangioma of skin and subcutaneous tissue: Secondary | ICD-10-CM | POA: Diagnosis not present

## 2018-01-27 DIAGNOSIS — L821 Other seborrheic keratosis: Secondary | ICD-10-CM | POA: Diagnosis not present

## 2018-01-27 DIAGNOSIS — M069 Rheumatoid arthritis, unspecified: Secondary | ICD-10-CM

## 2018-01-27 NOTE — Telephone Encounter (Signed)
TA-Plz see below/needing referral to Rheumatologist for Dx of Psoriasis per insurance/plz advise/thx dmf  Copied from [place] #[id number]. Topic: Referral - Request >> Jan 27, 2018  9:48 AM Hewitt Shorts wrote: Pt went to a dermatologist for psoriasis and is now needing a referral to a rheumatologist for arthritis issues and pt is needing a written referral for inusrance   Best number [phone]

## 2018-01-27 NOTE — Telephone Encounter (Signed)
Noted  Referral placed.

## 2018-01-31 NOTE — Progress Notes (Signed)
Office Visit Note  Patient: Richard Mcguire             Date of Birth: 12-15-1970           MRN: [id number]             PCP: Lucille Passy, MD Referring: Lucille Passy, MD Visit Date: 02/14/2018 Occupation: disabled truck driver  Subjective:   Pain in multiple joints   History of Present Illness: Richard Mcguire is a 47 y.o. male seen in consultation per request of his PCP.  According to patient he has had lower back pain most of his adult life.  He states in 2010 he developed sudden sharp lower back pain after coughing.  He was seen by Dr. Sherwood Gambler who can assume with DJD of the lumbar spine and he underwent L5-S1 fusion in July 2010.  He states prior to that he smoked 1-1/2 pack/day for 15 years.  Then he quit smoking.  He states the lower back pain persist.  He also has pain in his elbows, wrist and hands, hip joints, knee joints and ankle joints.  He has been getting pain medications from his PCP and then [facility].  He states he was on hydrocodone for 1 day half years and now he has been on morphine.  He states the PA [facility] was concerned that he may have underlying psoriatic arthritis.  He was referred to [facility] where he was seen by a PA and was told that he does have psoriasis.  He was also referred to me for evaluation of possible psoriatic arthritis.  He also gives history of bilateral carpal tunnel syndrome.  He denies any history of joint swelling.  Activities of Daily Living:  Patient reports morning stiffness for 10-20 minutes.   Patient Reports nocturnal pain.  Difficulty dressing/grooming: Denies Difficulty climbing stairs: Reports Difficulty getting out of chair: Reports Difficulty using hands for taps, buttons, cutlery, and/or writing: Reports  Review of Systems  Constitutional: Positive for fatigue. Negative for night sweats.  HENT: Negative for mouth sores, ear ringing, mouth dryness and nose  dryness.   Eyes: Negative for redness and dryness.  Respiratory: Negative for cough, shortness of breath, apnea and difficulty breathing.   Cardiovascular: Positive for hypertension. Negative for chest pain, palpitations, irregular heartbeat and swelling in legs/feet.  Gastrointestinal: Positive for constipation. Negative for blood in stool and diarrhea.  Endocrine: Negative for increased urination.  Genitourinary: Negative for urgency.  Musculoskeletal: Positive for arthralgias, joint pain, myalgias, muscle weakness, morning stiffness and myalgias. Negative for joint swelling and muscle tenderness.  Skin: Positive for rash. Negative for color change, hair loss, nodules/bumps, skin tightness, ulcers and sensitivity to sunlight.  Allergic/Immunologic: Negative for susceptible to infections.  Neurological: Negative for dizziness, fainting, numbness, headaches, memory loss, night sweats and weakness.  Hematological: Negative for swollen glands.  Psychiatric/Behavioral: Positive for depressed mood and sleep disturbance. The patient is nervous/anxious.     PMFS History:  Patient Active Problem List   Diagnosis Date Noted  . Migraine 02/15/2017  . Nocturia 02/15/2017  . Psoriasis 11/09/2016  . Depression 04/25/2015  . History of lumbar fusion 02/07/2014  . Chronic pain syndrome 01/04/2014  . OSA (obstructive sleep apnea) 06/16/2013  . Former tobacco use 06/12/2013  . Chronic fatigue 06/12/2013  . HTN (hypertension) 07/15/2012  . Morbid obesity ([place]) 11/20/2009  . MAJOR DPRSV DISORDER RECURRENT EPISODE MODERATE 03/28/2009    Past  Medical History:  Diagnosis Date  . Anxiety   . Chronic fatigue   . Chronic pain   . Dyslipidemia   . HTN (hypertension)   . Kidney stone   . Lumbar disc disease with radiculopathy    sees Dr. Rita Ohara  . Major depressive disorder, recurrent episode, moderate ([place])   . Migraine headache   . Morbid obesity ([place])   . Tachycardia    a. 07/2012 - placed on  BB. CT angio neg.  Marland Kitchen Urethral stricture    Was told he would need pediatric cath if ever had urinary cath    Family History  Problem Relation Age of Onset  . Hypertension Mother   . Cancer Mother   . Lumbar disc disease Mother   . Cancer Paternal Grandfather        lung  . CAD Maternal Grandmother        age 62 - CABG.  . Fibromyalgia Unknown        Sister and daughter  . Allergy (severe) Unknown    Past Surgical History:  Procedure Laterality Date  . CARDIAC CATHETERIZATION  05/2013   minimal coronary plaque with normal LV fxn (hochrein)  . fatty tumor removal    . LEFT HEART CATHETERIZATION WITH CORONARY ANGIOGRAM N/A 06/13/2013   Procedure: LEFT HEART CATHETERIZATION WITH CORONARY ANGIOGRAM;  Surgeon: Minus Breeding, MD;  Location: [facility] CATH LAB;  Service: Cardiovascular;  Laterality: N/A;  . LUMBAR FUSION  09/2008   Dr. Sherwood Gambler   Social History   Social History Narrative   Lives with wife and daughter.  Daughter has multiple problems, including chronic fatigue syndrome.   Does not have living will.  Would desire CPR and life support if not futile or prolonged.    Objective: Vital Signs: BP 127/85 (BP Location: Right Arm, Patient Position: Sitting, Cuff Size: Normal)   Pulse 87   Ht 6\' 1"  (1.854 m)   Wt (!) 355 lb (161 kg) Comment: 350+  BMI 46.84 kg/m    Physical Exam  Constitutional: He is oriented to person, place, and time. He appears well-developed and well-nourished.  HENT:  Head: Normocephalic and atraumatic.  Eyes: Pupils are equal, round, and reactive to light. Conjunctivae and EOM are normal.  Neck: Normal range of motion. Neck supple.  Cardiovascular: Normal rate, regular rhythm and normal heart sounds.  Pulmonary/Chest: Effort normal and breath sounds normal.  Abdominal: Soft. Bowel sounds are normal.  Neurological: He is alert and oriented to person, place, and time.  Skin: Skin is warm and dry. Capillary refill takes less than 2 seconds.    Erythematous a scaly rash was noted on his scalp.  He also has dry patches on his bilateral forearm and bilateral shin area.  He has erythema and purulent discharge from his umbilicus.  Psychiatric: He has a normal mood and affect. His behavior is normal.  Nursing note and vitals reviewed.    Musculoskeletal Exam: C-spine good range of motion.  Thoracic and lumbar spine limited range of motion.  He has postsurgical changes on his lumbar spine.  He has tenderness on palpation of the SI joints.  Shoulder joints were in good range of motion with some discomfort.  Elbow joints wrist joint MCPs PIPs DIPs were in good range of motion without any synovitis.  Hip joints knee joints ankles MTPs PIPs were in good range of motion without any synovitis.  He has no evidence of Achilles tendinitis or plantar fasciitis fasciitis on examination.  No dactylitis  was noted  CDAI Exam: No CDAI exam completed.   Investigation: Findings:  11/09/2017: Cholesterol 181, triglycerides 121, HDL 37.9, LDL 119, sed rate 12, CCP 25, RF <14, CRP 1.2  I reviewed records from [facility] and his skin care patient was evaluated by Dr. Pearline Cables according to his records he confirmed the diagnosis of psoriasis vulgaris.  Component     Latest Ref Rng & Units 11/09/2016  Cholesterol     0 - 200 mg/dL 181  Triglycerides     0.0 - 149.0 mg/dL 121.0  HDL Cholesterol     >39.00 mg/dL 37.90 (L)  VLDL     0.0 - 40.0 mg/dL 24.2  LDL (calc)     0 - 99 mg/dL 119 (H)  Total CHOL/HDL Ratio      5  NonHDL      143.47  Sed Rate     0 - 15 mm/hr 12  Cyclic Citrullin Peptide Ab     Units 25 (H)  RA Latex Turbid.     <14 IU/mL <14  CRP     0.5 - 20.0 mg/dL 1.2   Imaging: Xr Foot 2 Views Left  Result Date: 02/14/2018 First MTP, all PIP and DIP narrowing was noted.  No erosive changes were put noted.  No intertarsal joint space narrowing was noted.   Posterior calcaneal spur was noted. Impression: These findings are consistent  with osteoarthritis of the foot.  Xr Foot 2 Views Right  Result Date: 02/14/2018 First MTP, all PIP and DIP narrowing was noted.  No erosive changes were put noted.  No intertarsal joint space narrowing was noted.  Dorsal spur was noted.  Posterior calcaneal spur was noted. Impression: These findings are consistent with osteoarthritis of the foot.  Xr Hand 2 View Left  Result Date: 02/14/2018 Minimal PIP narrowing was noted.  No MCP intercarpal radiocarpal joint space narrowing was noted.  No erosive changes were noted. Impression: These findings are consistent with mild osteoarthritis of the hand.  Xr Hand 2 View Right  Result Date: 02/14/2018 PIP and DIP minimal narrowing was noted.  No erosive changes were noted.  No MCP intercarpal radiocarpal joint space narrowing was noted. Impression: These findings are consistent with mild osteoarthritis of the hand.  Xr Knee 3 View Left  Result Date: 02/14/2018 Mild medial compartment narrowing was noted.  No chondrocalcinosis was noted.  Moderate patellofemoral narrowing was noted. Impression: Mild osteoarthritis and moderate chondromalacia patella of the knee joint.  Xr Knee 3 View Right  Result Date: 02/14/2018 Mild medial compartment narrowing was noted.  No chondrocalcinosis was noted.  Moderate patellofemoral narrowing was noted. Impression: Mild osteoarthritis and moderate chondromalacia patella of the knee joint.  Xr Lumbar Spine 2-3 Views  Result Date: 02/14/2018 Mild multilevel spondylosis was noted.  Anterior spurring was noted.  L5-S1 fusion was noted.  Facet joint arthropathy was noted.  No syndesmophytes were noted. Impression: These findings are consistent with degenerative disc disease of lumbar spine and facet joint arthropathy.  Xr Pelvis 1-2 Views  Result Date: 02/14/2018 No SI joint narrowing or sclerosis was noted.   Recent Labs: Lab Results  Component Value Date   WBC 11.5 (H) 04/25/2015   HGB 14.1 04/25/2015   PLT 270.0  04/25/2015   NA 139 02/15/2017   K 4.5 02/15/2017   CL 102 02/15/2017   CO2 27 02/15/2017   GLUCOSE 94 02/15/2017   BUN 14 02/15/2017   CREATININE 0.74 02/15/2017   BILITOT 0.4 02/15/2017  ALKPHOS 114 02/15/2017   AST 17 02/15/2017   ALT 19 02/15/2017   PROT 6.3 02/15/2017   ALBUMIN 3.8 02/15/2017   CALCIUM 9.1 02/15/2017   GFRAA >90 08/06/2014    Speciality Comments: No specialty comments available.  Procedures:  No procedures performed Allergies: Patient has no known allergies.   Assessment / Plan:     Visit Diagnoses: Polyarthralgia -patient gives history of pain and discomfort in multiple joints.  He has been on pain medications for several years.  He had no synovitis on examination today.  11/09/17: RF<14, CCP 25, sed rate 12, CPR 1.2.  It is not unusual to see positive anti-CCP antibody with psoriasis.  DDD (degenerative disc disease), lumbar - w/ radiculopathy -he has been having severe lower back pain and discomfort.  He requested MRI of his lumbar spine for evaluation.  Plan: MR LUMBAR SPINE WO CONTRAST, MR SACRUM SI JOINTS WO CONTRAST  History of lumbar fusion - Plan: MR LUMBAR SPINE WO CONTRAST, MR SACRUM SI JOINTS WO CONTRAST  Chronic SI joint pain -he has been having discomfort in his SI joints.  He does have psoriasis but I could not find synovitis on examination.  His x-rays were unremarkable.  I will schedule MRI to evaluate this further.  Plan: XR Pelvis 1-2 Views, MR LUMBAR SPINE WO CONTRAST, MR SACRUM SI JOINTS WO CONTRAST  Chronic midline low back pain without sciatica - Plan: XR Lumbar Spine 2-3 Views, MR LUMBAR SPINE WO CONTRAST, MR SACRUM SI JOINTS WO CONTRAST  Pain in both hands -he has been having discomfort in his bilateral hands.  He also complains of carpal tunnel syndrome.  I offered nerve conduction velocities which she declined.  Plan : XR Hand 2 View Right, XR Hand 2 View Left.  The x-rays of bilateral hands were unremarkable except for mild  osteoarthritic changes.  Chronic pain of both knees -he complains of significant discomfort in his bilateral knee joints.  The x-ray findings were consistent with osteoarthritis.  Plan: XR KNEE 3 VIEW RIGHT, XR KNEE 3 VIEW LEFT  Pain in both feet -he complains of discomfort in his bilateral feet.  There was no evidence of any dactylitis.  Plan: XR Foot 2 Views Right, XR Foot 2 Views Left.  The x-rays findings were consistent with osteoarthritis.  Psoriasis-patient was seen at [facility] by Dr. Pearline Cables who diagnosed him with psoriasis.  He does have some rash on his scalp on his bilateral forearm and shin area.  Chronic pain syndrome-he has been going to pain clinic for several years.  Other medical problems are listed as follows:  Chronic fatigue  Essential hypertension  Hx of migraines  OSA (obstructive sleep apnea)  Former tobacco use  Anxiety and depression  History of kidney stones   Orders: Orders Placed This Encounter  Procedures  . XR Pelvis 1-2 Views  . XR Lumbar Spine 2-3 Views  . XR Hand 2 View Right  . XR Hand 2 View Left  . XR KNEE 3 VIEW RIGHT  . XR KNEE 3 VIEW LEFT  . XR Foot 2 Views Right  . XR Foot 2 Views Left  . MR LUMBAR SPINE WO CONTRAST  . MR SACRUM SI JOINTS WO CONTRAST   No orders of the defined types were placed in this encounter.   Face-to-face time spent with patient was 50 minutes. Greater than 50% of time was spent in counseling and coordination of care.  Follow-Up Instructions: Return for Psoriasis, osteoarthritis, DDD.  Bo Merino, MD  Note - This record has been created using Editor, commissioning.  Chart creation errors have been sought, but may not always  have been located. Such creation errors do not reflect on  the standard of medical care.

## 2018-02-02 ENCOUNTER — Other Ambulatory Visit: Payer: Self-pay | Admitting: Family Medicine

## 2018-02-14 ENCOUNTER — Ambulatory Visit (INDEPENDENT_AMBULATORY_CARE_PROVIDER_SITE_OTHER): Payer: Self-pay

## 2018-02-14 ENCOUNTER — Ambulatory Visit: Payer: Medicare Other | Admitting: Rheumatology

## 2018-02-14 ENCOUNTER — Encounter: Payer: Self-pay | Admitting: Rheumatology

## 2018-02-14 VITALS — BP 127/85 | HR 87 | Ht 73.0 in | Wt 355.0 lb

## 2018-02-14 DIAGNOSIS — M79672 Pain in left foot: Secondary | ICD-10-CM

## 2018-02-14 DIAGNOSIS — Z87891 Personal history of nicotine dependence: Secondary | ICD-10-CM

## 2018-02-14 DIAGNOSIS — I10 Essential (primary) hypertension: Secondary | ICD-10-CM

## 2018-02-14 DIAGNOSIS — M79641 Pain in right hand: Secondary | ICD-10-CM

## 2018-02-14 DIAGNOSIS — M533 Sacrococcygeal disorders, not elsewhere classified: Secondary | ICD-10-CM | POA: Diagnosis not present

## 2018-02-14 DIAGNOSIS — M2550 Pain in unspecified joint: Secondary | ICD-10-CM

## 2018-02-14 DIAGNOSIS — M25562 Pain in left knee: Secondary | ICD-10-CM

## 2018-02-14 DIAGNOSIS — G894 Chronic pain syndrome: Secondary | ICD-10-CM

## 2018-02-14 DIAGNOSIS — M79642 Pain in left hand: Secondary | ICD-10-CM

## 2018-02-14 DIAGNOSIS — M5136 Other intervertebral disc degeneration, lumbar region: Secondary | ICD-10-CM | POA: Diagnosis not present

## 2018-02-14 DIAGNOSIS — M545 Low back pain: Secondary | ICD-10-CM

## 2018-02-14 DIAGNOSIS — M0600 Rheumatoid arthritis without rheumatoid factor, unspecified site: Secondary | ICD-10-CM | POA: Insufficient documentation

## 2018-02-14 DIAGNOSIS — Z981 Arthrodesis status: Secondary | ICD-10-CM

## 2018-02-14 DIAGNOSIS — L409 Psoriasis, unspecified: Secondary | ICD-10-CM

## 2018-02-14 DIAGNOSIS — Z8669 Personal history of other diseases of the nervous system and sense organs: Secondary | ICD-10-CM

## 2018-02-14 DIAGNOSIS — Z87442 Personal history of urinary calculi: Secondary | ICD-10-CM

## 2018-02-14 DIAGNOSIS — G8929 Other chronic pain: Secondary | ICD-10-CM

## 2018-02-14 DIAGNOSIS — R5382 Chronic fatigue, unspecified: Secondary | ICD-10-CM

## 2018-02-14 DIAGNOSIS — F329 Major depressive disorder, single episode, unspecified: Secondary | ICD-10-CM

## 2018-02-14 DIAGNOSIS — F419 Anxiety disorder, unspecified: Secondary | ICD-10-CM

## 2018-02-14 DIAGNOSIS — M25561 Pain in right knee: Secondary | ICD-10-CM

## 2018-02-14 DIAGNOSIS — M79671 Pain in right foot: Secondary | ICD-10-CM

## 2018-02-14 DIAGNOSIS — G4733 Obstructive sleep apnea (adult) (pediatric): Secondary | ICD-10-CM

## 2018-02-15 DIAGNOSIS — G894 Chronic pain syndrome: Secondary | ICD-10-CM | POA: Diagnosis not present

## 2018-02-15 DIAGNOSIS — M47816 Spondylosis without myelopathy or radiculopathy, lumbar region: Secondary | ICD-10-CM | POA: Diagnosis not present

## 2018-02-23 ENCOUNTER — Ambulatory Visit (HOSPITAL_COMMUNITY)
Admission: RE | Admit: 2018-02-23 | Discharge: 2018-02-23 | Disposition: A | Discharge status: Home / Self Care | Payer: Medicare Other | Source: Ambulatory Visit | Attending: Rheumatology | Admitting: Rheumatology

## 2018-02-23 ENCOUNTER — Ambulatory Visit (HOSPITAL_COMMUNITY): Payer: Medicare Other

## 2018-02-23 DIAGNOSIS — M48061 Spinal stenosis, lumbar region without neurogenic claudication: Secondary | ICD-10-CM | POA: Insufficient documentation

## 2018-02-23 DIAGNOSIS — Z981 Arthrodesis status: Secondary | ICD-10-CM | POA: Diagnosis not present

## 2018-02-23 DIAGNOSIS — M533 Sacrococcygeal disorders, not elsewhere classified: Secondary | ICD-10-CM | POA: Diagnosis not present

## 2018-02-23 DIAGNOSIS — M545 Low back pain: Secondary | ICD-10-CM | POA: Diagnosis not present

## 2018-02-23 DIAGNOSIS — G8929 Other chronic pain: Secondary | ICD-10-CM | POA: Diagnosis not present

## 2018-02-23 DIAGNOSIS — M5126 Other intervertebral disc displacement, lumbar region: Secondary | ICD-10-CM | POA: Diagnosis not present

## 2018-02-23 DIAGNOSIS — M5136 Other intervertebral disc degeneration, lumbar region: Secondary | ICD-10-CM | POA: Diagnosis not present

## 2018-02-24 NOTE — Progress Notes (Signed)
He has multilevel disc disease with mild to moderate spinal stenosis.  He may benefit from a referral to a spinal specialist.

## 2018-02-24 NOTE — Progress Notes (Signed)
MRI of the SI joints was unremarkable.

## 2018-03-03 ENCOUNTER — Telehealth: Payer: Self-pay | Admitting: Rheumatology

## 2018-03-03 NOTE — Telephone Encounter (Signed)
Patient advised of MRI results.

## 2018-03-03 NOTE — Telephone Encounter (Signed)
Patient called stating he was returning your call.   

## 2018-03-15 ENCOUNTER — Other Ambulatory Visit: Payer: Self-pay | Admitting: Family Medicine

## 2018-03-16 DIAGNOSIS — M47816 Spondylosis without myelopathy or radiculopathy, lumbar region: Secondary | ICD-10-CM | POA: Diagnosis not present

## 2018-03-16 DIAGNOSIS — M545 Low back pain: Secondary | ICD-10-CM | POA: Diagnosis not present

## 2018-03-16 DIAGNOSIS — G894 Chronic pain syndrome: Secondary | ICD-10-CM | POA: Diagnosis not present

## 2018-03-16 MED ORDER — PAROXETINE HCL 20 MG PO TABS: 20.0000 mg | ORAL_TABLET | Freq: Every day | ORAL | 1 refills | Status: DC

## 2018-03-16 MED ORDER — LISINOPRIL 20 MG PO TABS: 20.0000 mg | ORAL_TABLET | Freq: Every day | ORAL | 1 refills | Status: DC

## 2018-03-23 DIAGNOSIS — M533 Sacrococcygeal disorders, not elsewhere classified: Secondary | ICD-10-CM

## 2018-03-23 DIAGNOSIS — M19041 Primary osteoarthritis, right hand: Secondary | ICD-10-CM | POA: Insufficient documentation

## 2018-03-23 DIAGNOSIS — M5136 Other intervertebral disc degeneration, lumbar region: Secondary | ICD-10-CM | POA: Insufficient documentation

## 2018-03-23 DIAGNOSIS — G8929 Other chronic pain: Secondary | ICD-10-CM | POA: Insufficient documentation

## 2018-03-23 DIAGNOSIS — M19071 Primary osteoarthritis, right ankle and foot: Secondary | ICD-10-CM | POA: Insufficient documentation

## 2018-03-23 DIAGNOSIS — M19042 Primary osteoarthritis, left hand: Secondary | ICD-10-CM

## 2018-03-23 DIAGNOSIS — M19072 Primary osteoarthritis, left ankle and foot: Secondary | ICD-10-CM

## 2018-03-23 NOTE — Progress Notes (Deleted)
Office Visit Note  Patient: Richard Mcguire             Date of Birth: 10/21/1970           MRN: [id number]             PCP: Lucille Passy, MD Referring: Lucille Passy, MD Visit Date: 04/06/2018 Occupation: @GUAROCC @  Subjective:  No chief complaint on file.   History of Present Illness: Richard Mcguire is a 47 y.o. male ***   Activities of Daily Living:  Patient reports morning stiffness for *** {minute/hour:19697}.   Patient {ACTIONS;DENIES/REPORTS:21021675::"Denies"} nocturnal pain.  Difficulty dressing/grooming: {ACTIONS;DENIES/REPORTS:21021675::"Denies"} Difficulty climbing stairs: {ACTIONS;DENIES/REPORTS:21021675::"Denies"} Difficulty getting out of chair: {ACTIONS;DENIES/REPORTS:21021675::"Denies"} Difficulty using hands for taps, buttons, cutlery, and/or writing: {ACTIONS;DENIES/REPORTS:21021675::"Denies"}  No Rheumatology ROS completed.   PMFS History:  Patient Active Problem List   Diagnosis Date Noted  . Migraine 02/15/2017  . Nocturia 02/15/2017  . Psoriasis 11/09/2016  . Depression 04/25/2015  . History of lumbar fusion 02/07/2014  . Chronic pain syndrome 01/04/2014  . OSA (obstructive sleep apnea) 06/16/2013  . Former tobacco use 06/12/2013  . Chronic fatigue 06/12/2013  . HTN (hypertension) 07/15/2012  . Morbid obesity ([place]) 11/20/2009  . MAJOR DPRSV DISORDER RECURRENT EPISODE MODERATE 03/28/2009    Past Medical History:  Diagnosis Date  . Anxiety   . Chronic fatigue   . Chronic pain   . Dyslipidemia   . HTN (hypertension)   . Kidney stone   . Lumbar disc disease with radiculopathy    sees Dr. Rita Ohara  . Major depressive disorder, recurrent episode, moderate ([place])   . Migraine headache   . Morbid obesity ([place])   . Tachycardia    a. 07/2012 - placed on BB. CT angio neg.  Marland Kitchen Urethral stricture    Was told he would need pediatric cath if ever had urinary cath    Family History  Problem Relation Age of Onset  . Hypertension Mother   .  Cancer Mother   . Lumbar disc disease Mother   . Cancer Paternal Grandfather        lung  . CAD Maternal Grandmother        age 16 - CABG.  . Fibromyalgia Unknown        Sister and daughter  . Allergy (severe) Unknown    Past Surgical History:  Procedure Laterality Date  . CARDIAC CATHETERIZATION  05/2013   minimal coronary plaque with normal LV fxn (hochrein)  . fatty tumor removal    . LEFT HEART CATHETERIZATION WITH CORONARY ANGIOGRAM N/A 06/13/2013   Procedure: LEFT HEART CATHETERIZATION WITH CORONARY ANGIOGRAM;  Surgeon: Minus Breeding, MD;  Location: [facility] CATH LAB;  Service: Cardiovascular;  Laterality: N/A;  . LUMBAR FUSION  09/2008   Dr. Sherwood Gambler   Social History   Social History Narrative   Lives with wife and daughter.  Daughter has multiple problems, including chronic fatigue syndrome.   Does not have living will.  Would desire CPR and life support if not futile or prolonged.    Objective: Vital Signs: There were no vitals taken for this visit.   Physical Exam   Musculoskeletal Exam: ***  CDAI Exam: CDAI Score: Not documented Patient Global Assessment: Not documented; Provider Global Assessment: Not documented Swollen: Not documented; Tender: Not documented Joint Exam   Not documented   There is currently no information documented on the homunculus. Go to the Rheumatology activity and complete the homunculus joint exam.  Investigation: No additional findings.  Imaging: Mr Lumbar Spine Wo Contrast  Result Date: 02/23/2018 CLINICAL DATA:  Initial evaluation for acute back pain with left leg radiculopathy, history of prior lumbosacral fusion. EXAM: MRI LUMBAR SPINE WITHOUT CONTRAST MRI SACRUM WITHOUT CONTRAST TECHNIQUE: Multiplanar, multisequence MR imaging of the lumbar spine and sacrum was performed. No intravenous contrast was administered. COMPARISON:  Prior MRI from 12/01/2011. FINDINGS: MRI LUMBAR SPINE FINDINGS: Segmentation: Normal segmentation. Lowest  well-formed disc labeled the L5-S1 level. Alignment: Chronic bilateral pars defects at L5 with associated chronic spondylolisthesis measuring up to 6 mm, stable from previous. Trace retrolisthesis of L2 on L3, L3 on L4, and L4 on L5. Alignment otherwise normal with preservation of the normal lumbar lordosis. Vertebrae: Susceptibility artifact from prior PLIF at the L5-S1 level. Reactive endplate changes about the L5-S1 interspace. Vertebral body heights maintained without evidence for acute or chronic fracture. Bone marrow signal intensity within normal limits. No discrete or worrisome osseous lesions. No abnormal marrow edema. Conus medullaris and cauda equina: Conus extends to the L1-2 level. Conus and cauda equina appear normal. Paraspinal and other soft tissues: Paraspinous soft tissues within normal limits. Visualized visceral structures unremarkable Disc levels: L1-2: Mild bilateral facet hypertrophy. Normal interspace. No significant canal or foraminal stenosis. L2-3: Trace retrolisthesis. Chronic intervertebral disc space narrowing with diffuse disc bulge and disc desiccation. Superimposed left subarticular disc extrusion with inferior migration and probable sequestration. Disc fragment measures 1.0 x 0.9 x 1.5 cm in size (series 5, image 10). Inferior migration extends up to 2 cm below the parent L2-3 interspace. Resultant severe left lateral recess stenosis with probable impingement of the descending left L3 nerve root. Mild facet hypertrophy with resultant mild to moderate spinal stenosis, progressed from previous. Foramina remain patent. L3-4: Trace retrolisthesis. Chronic intervertebral disc space narrowing with shallow posterior disc bulge. Mild facet hypertrophy. Epidural lipomatosis. Resultant moderate spinal stenosis, progressed from previous. Foramina are patent. L4-5: Diffuse disc bulge with disc desiccation. Moderate facet and ligament flavum hypertrophy. Epidural lipomatosis. Resultant  progressive moderate to severe canal with bilateral lateral recess stenosis, primarily due to worsened facet disease. Accessory ossicle at the superior aspect of the left L4-5 facet with resultant moderate left L4 foraminal narrowing, similar to previous. Right neural foramen remains patent. L5-S1: Chronic 6 mm spondylolisthesis. Prior PLIF. No residual or recurrent spinal stenosis. Foramina remain patent. MRI SACRUM FINDINGS: Visualized bone marrow signal intensity is normal. No evidence for acute or chronic fracture. No discrete or worrisome osseous lesions. No abnormal marrow edema. SI joints are symmetric and within normal limits with no imaging findings to suggest sacroiliitis. Sacral nerve roots seen coursing normally through the sacral foramina without impingement or other abnormality. Visualized musculature within normal limits without abnormality. Visualized intra-abdominal and pelvic structures are unremarkable. IMPRESSION: MRI LUMBAR SPINE IMPRESSION: 1. Moderate size left subarticular disc extrusion with probable sequestration at L2-3 with resultant severe left lateral recess stenosis and probable left L3 nerve root impingement. 2. Adjacent segment disease at L4-5 with associated disc bulge and facet hypertrophy, resulting in progressive moderate to severe canal and bilateral subarticular stenosis. 3. Progressive degenerative spondylolysis at L2-3 and L3-4 with resultant mild to moderate spinal stenosis. 4. Stable postsurgical changes from prior PLIF at L5-S1 without residual or recurrent stenosis. MRI SACRUM IMPRESSION: Negative MRI of the sacrum. No evidence for sacroiliitis or other abnormality. Electronically Signed   By: Jeannine Boga M.D.   On: 02/23/2018 22:37   Mr Sacrum Si Joints Wo Contrast  Result Date: 02/23/2018 CLINICAL DATA:  Initial  evaluation for acute back pain with left leg radiculopathy, history of prior lumbosacral fusion. EXAM: MRI LUMBAR SPINE WITHOUT CONTRAST MRI SACRUM  WITHOUT CONTRAST TECHNIQUE: Multiplanar, multisequence MR imaging of the lumbar spine and sacrum was performed. No intravenous contrast was administered. COMPARISON:  Prior MRI from 12/01/2011. FINDINGS: MRI LUMBAR SPINE FINDINGS: Segmentation: Normal segmentation. Lowest well-formed disc labeled the L5-S1 level. Alignment: Chronic bilateral pars defects at L5 with associated chronic spondylolisthesis measuring up to 6 mm, stable from previous. Trace retrolisthesis of L2 on L3, L3 on L4, and L4 on L5. Alignment otherwise normal with preservation of the normal lumbar lordosis. Vertebrae: Susceptibility artifact from prior PLIF at the L5-S1 level. Reactive endplate changes about the L5-S1 interspace. Vertebral body heights maintained without evidence for acute or chronic fracture. Bone marrow signal intensity within normal limits. No discrete or worrisome osseous lesions. No abnormal marrow edema. Conus medullaris and cauda equina: Conus extends to the L1-2 level. Conus and cauda equina appear normal. Paraspinal and other soft tissues: Paraspinous soft tissues within normal limits. Visualized visceral structures unremarkable Disc levels: L1-2: Mild bilateral facet hypertrophy. Normal interspace. No significant canal or foraminal stenosis. L2-3: Trace retrolisthesis. Chronic intervertebral disc space narrowing with diffuse disc bulge and disc desiccation. Superimposed left subarticular disc extrusion with inferior migration and probable sequestration. Disc fragment measures 1.0 x 0.9 x 1.5 cm in size (series 5, image 10). Inferior migration extends up to 2 cm below the parent L2-3 interspace. Resultant severe left lateral recess stenosis with probable impingement of the descending left L3 nerve root. Mild facet hypertrophy with resultant mild to moderate spinal stenosis, progressed from previous. Foramina remain patent. L3-4: Trace retrolisthesis. Chronic intervertebral disc space narrowing with shallow posterior disc  bulge. Mild facet hypertrophy. Epidural lipomatosis. Resultant moderate spinal stenosis, progressed from previous. Foramina are patent. L4-5: Diffuse disc bulge with disc desiccation. Moderate facet and ligament flavum hypertrophy. Epidural lipomatosis. Resultant progressive moderate to severe canal with bilateral lateral recess stenosis, primarily due to worsened facet disease. Accessory ossicle at the superior aspect of the left L4-5 facet with resultant moderate left L4 foraminal narrowing, similar to previous. Right neural foramen remains patent. L5-S1: Chronic 6 mm spondylolisthesis. Prior PLIF. No residual or recurrent spinal stenosis. Foramina remain patent. MRI SACRUM FINDINGS: Visualized bone marrow signal intensity is normal. No evidence for acute or chronic fracture. No discrete or worrisome osseous lesions. No abnormal marrow edema. SI joints are symmetric and within normal limits with no imaging findings to suggest sacroiliitis. Sacral nerve roots seen coursing normally through the sacral foramina without impingement or other abnormality. Visualized musculature within normal limits without abnormality. Visualized intra-abdominal and pelvic structures are unremarkable. IMPRESSION: MRI LUMBAR SPINE IMPRESSION: 1. Moderate size left subarticular disc extrusion with probable sequestration at L2-3 with resultant severe left lateral recess stenosis and probable left L3 nerve root impingement. 2. Adjacent segment disease at L4-5 with associated disc bulge and facet hypertrophy, resulting in progressive moderate to severe canal and bilateral subarticular stenosis. 3. Progressive degenerative spondylolysis at L2-3 and L3-4 with resultant mild to moderate spinal stenosis. 4. Stable postsurgical changes from prior PLIF at L5-S1 without residual or recurrent stenosis. MRI SACRUM IMPRESSION: Negative MRI of the sacrum. No evidence for sacroiliitis or other abnormality. Electronically Signed   By: Jeannine Boga M.D.   On: 02/23/2018 22:37    Recent Labs: Lab Results  Component Value Date   WBC 11.5 (H) 04/25/2015   HGB 14.1 04/25/2015   PLT 270.0 04/25/2015  NA 139 02/15/2017   K 4.5 02/15/2017   CL 102 02/15/2017   CO2 27 02/15/2017   GLUCOSE 94 02/15/2017   BUN 14 02/15/2017   CREATININE 0.74 02/15/2017   BILITOT 0.4 02/15/2017   ALKPHOS 114 02/15/2017   AST 17 02/15/2017   ALT 19 02/15/2017   PROT 6.3 02/15/2017   ALBUMIN 3.8 02/15/2017   CALCIUM 9.1 02/15/2017   GFRAA >90 08/06/2014    Speciality Comments: No specialty comments available.  Procedures:  No procedures performed Allergies: Patient has no known allergies.   Assessment / Plan:     Visit Diagnoses: Polyarthralgia - Anti-CCP 25, RF negative, ESR normal patient had no synovitis on examination.  DDD (degenerative disc disease), lumbar - MRI lumbar spine consistent with DDD.  Chronic SI joint pain - MRI of sacrum negative for sacroiliitis.  Primary osteoarthritis of both hands - mild  Primary osteoarthritis of both feet  Psoriasis - Diagnosed by Dr. Pearline Cables at [facility] dermatology  Chronic pain syndrome - Followed up with pain clinic   Other medical problems are listed as follows:  Chronic fatigue  Essential hypertension  Hx of migraines  OSA (obstructive sleep apnea)  Former tobacco use  Anxiety and depression  History of kidney stones   Orders: No orders of the defined types were placed in this encounter.  No orders of the defined types were placed in this encounter.   Face-to-face time spent with patient was *** minutes. Greater than 50% of time was spent in counseling and coordination of care.  Follow-Up Instructions: No follow-ups on file.   Bo Merino, MD  Note - This record has been created using Editor, commissioning.  Chart creation errors have been sought, but may not always  have been located. Such creation errors do not reflect on  the standard of medical  care.

## 2018-04-06 ENCOUNTER — Ambulatory Visit: Payer: Self-pay | Admitting: Rheumatology

## 2018-04-15 ENCOUNTER — Other Ambulatory Visit: Payer: Self-pay | Admitting: Neurosurgery

## 2018-04-15 DIAGNOSIS — M5416 Radiculopathy, lumbar region: Secondary | ICD-10-CM | POA: Diagnosis not present

## 2018-04-15 DIAGNOSIS — M5126 Other intervertebral disc displacement, lumbar region: Secondary | ICD-10-CM | POA: Diagnosis not present

## 2018-04-15 DIAGNOSIS — M48062 Spinal stenosis, lumbar region with neurogenic claudication: Secondary | ICD-10-CM | POA: Diagnosis not present

## 2018-04-15 DIAGNOSIS — M5136 Other intervertebral disc degeneration, lumbar region: Secondary | ICD-10-CM | POA: Diagnosis not present

## 2018-04-20 DIAGNOSIS — G894 Chronic pain syndrome: Secondary | ICD-10-CM | POA: Diagnosis not present

## 2018-04-20 DIAGNOSIS — Z79891 Long term (current) use of opiate analgesic: Secondary | ICD-10-CM | POA: Diagnosis not present

## 2018-04-20 DIAGNOSIS — M47816 Spondylosis without myelopathy or radiculopathy, lumbar region: Secondary | ICD-10-CM | POA: Diagnosis not present

## 2018-04-20 DIAGNOSIS — Z79899 Other long term (current) drug therapy: Secondary | ICD-10-CM | POA: Diagnosis not present

## 2018-04-26 ENCOUNTER — Encounter (HOSPITAL_COMMUNITY): Payer: Self-pay

## 2018-04-26 ENCOUNTER — Other Ambulatory Visit: Payer: Self-pay | Admitting: Neurosurgery

## 2018-04-26 NOTE — Pre-Procedure Instructions (Signed)
Richard Mcguire  04/26/2018      [id number] - [place], [place] - [address] [place] [address] Phone: [phone] Fax: [phone]  [place] [facility] [address] [place] [address] [place] [address] Phone: [phone] Fax: [phone]    Your procedure is scheduled on October 23rd.  Report to [facility] Admitting at [place].[facility].  Call this number if you have problems the morning of surgery:  [phone]   Remember:  Do not eat or drink after midnight.    Take these medicines the morning of surgery with A SIP OF WATER   Gabapentin  Metoprolol  Morpine  Paxil  Zanaflex   7 days prior to surgery STOP taking any Aspirin(unless otherwise instructed by your surgeon), Aleve, Naproxen, Ibuprofen, Motrin, Advil, Goody's, BC's, all herbal medications, fish oil, and all vitamins     Do not wear jewelry.  Do not wear lotions, powders, or perfumes, or deodorant.  Men may shave face and neck.  Do not bring valuables to the hospital.  [facility] is not responsible for any belongings or valuables.  Contacts, dentures or bridgework may not be worn into surgery.  Leave your suitcase in the car.  After surgery it may be brought to your room.  For patients admitted to the hospital, discharge time will be determined by your treatment team.  Patients discharged the day of surgery will not be allowed to drive home.    [facility]- Preparing For Surgery  Before surgery, you can play an important role. Because skin is not sterile, your skin needs to be as free of germs as possible. You can reduce the number of germs on your skin by washing with CHG (chlorahexidine gluconate) Soap before surgery.  CHG is an antiseptic cleaner which kills germs and bonds with the skin to continue killing germs even after washing.    Oral Hygiene is also important to reduce your risk of infection.   Remember - BRUSH YOUR TEETH THE MORNING OF SURGERY WITH YOUR REGULAR TOOTHPASTE  Please do not use if you have an allergy to CHG or antibacterial soaps. If your skin becomes reddened/irritated stop using the CHG.  Do not shave (including legs and underarms) for at least 48 hours prior to first CHG shower. It is OK to shave your face.  Please follow these instructions carefully.   1. Shower the NIGHT BEFORE SURGERY and the MORNING OF SURGERY with CHG.   2. If you chose to wash your hair, wash your hair first as usual with your normal shampoo.  3. After you shampoo, rinse your hair and body thoroughly to remove the shampoo.  4. Use CHG as you would any other liquid soap. You can apply CHG directly to the skin and wash gently with a scrungie or a clean washcloth.   5. Apply the CHG Soap to your body ONLY FROM THE NECK DOWN.  Do not use on open wounds or open sores. Avoid contact with your eyes, ears, mouth and genitals (private parts). Wash Face and genitals (private parts)  with your normal soap.  6. Wash thoroughly, paying special attention to the area where your surgery will be performed.  7. Thoroughly rinse your body with warm water from the neck down.  8. DO NOT shower/wash with your normal soap after using and rinsing off the CHG Soap.  9. Pat yourself dry with a CLEAN  TOWEL.  10. Wear CLEAN PAJAMAS to bed the night before surgery, wear comfortable clothes the morning of surgery  11. Place CLEAN SHEETS on your bed the night of your first shower and DO NOT SLEEP WITH PETS.    Day of Surgery:  Do not apply any deodorants/lotions.  Please wear clean clothes to the hospital/surgery center.   Remember to brush your teeth WITH YOUR REGULAR TOOTHPASTE.    Please read over the following fact sheets that you were given.

## 2018-04-27 ENCOUNTER — Other Ambulatory Visit: Payer: Self-pay

## 2018-04-27 ENCOUNTER — Encounter (HOSPITAL_COMMUNITY)
Admission: RE | Admit: 2018-04-27 | Discharge: 2018-04-27 | Disposition: A | Discharge status: Home / Self Care | Payer: Medicare Other | Source: Ambulatory Visit | Attending: Neurosurgery | Admitting: Neurosurgery

## 2018-04-27 ENCOUNTER — Encounter (HOSPITAL_COMMUNITY): Payer: Self-pay

## 2018-04-27 DIAGNOSIS — Z01818 Encounter for other preprocedural examination: Secondary | ICD-10-CM | POA: Insufficient documentation

## 2018-04-27 DIAGNOSIS — M5126 Other intervertebral disc displacement, lumbar region: Secondary | ICD-10-CM | POA: Diagnosis not present

## 2018-04-27 HISTORY — DX: Personal history of urinary calculi: Z87.442

## 2018-04-27 HISTORY — DX: Sleep apnea, unspecified: G47.30

## 2018-04-27 LAB — BASIC METABOLIC PANEL
ANION GAP: 7 (ref 5–15)
BUN: 14 mg/dL (ref 6–20)
CO2: 26 mmol/L (ref 22–32)
Calcium: 8.9 mg/dL (ref 8.9–10.3)
Chloride: 103 mmol/L (ref 98–111)
Creatinine, Ser: 0.85 mg/dL (ref 0.61–1.24)
GFR calc Af Amer: >60 mL/min (ref >60)
GFR calc non Af Amer: >60 mL/min (ref >60)
GLUCOSE: 99 mg/dL (ref 70–99)
Potassium: 4.4 mmol/L (ref 3.5–5.1)
Sodium: 136 mmol/L (ref 135–145)

## 2018-04-27 LAB — CBC
HCT: 43.5 % (ref 39.0–52.0)
HEMOGLOBIN: 13 g/dL (ref 13.0–17.0)
MCH: 24.3 pg — AB (ref 26.0–34.0)
MCHC: 29.9 g/dL — AB (ref 30.0–36.0)
MCV: 81.2 fL (ref 80.0–100.0)
Platelets: 269 10*3/uL (ref 150–400)
RBC: 5.36 MIL/uL (ref 4.22–5.81)
RDW: 14.7 % (ref 11.5–15.5)
WBC: 12.3 10*3/uL — ABNORMAL HIGH (ref 4.0–10.5)
nRBC: 0 % (ref 0.0–0.2)

## 2018-04-27 LAB — SURGICAL PCR SCREEN
MRSA, PCR: NEGATIVE
Staphylococcus aureus: POSITIVE — AB

## 2018-04-27 NOTE — Progress Notes (Addendum)
PCP: Arnette Norris, MD  Cardiologist: pt denies  EKG: 04/27/18 in EPIC  Stress test: denies  ECHO: 2014  Cardiac Cath: 2014 in EPIC  Chest x-ray: pt denies past year, no recent respiratory infections/complications  PCR positive for Staph-called and left message for patient on confidential voice mail per request and [facility] [address] [facility] [place]

## 2018-04-28 NOTE — Progress Notes (Signed)
Anesthesia Chart Review:  Case:  [id number] Date/Time:  05/04/18 0715   Procedure:  LEFT LUMBAR 2- LUMBAR 3  LAMINOTOMY AND MICRODISCECTOMY (Left ) - LEFT LUMBAR 2- LUMBAR 3  LAMINOTOMY AND MICRODISCECTOMY   Anesthesia type:  General   Pre-op diagnosis:  HERNIATED NUCLEUS PULPOSUS, LUMBAR   Location:  [facility] OR ROOM 20 / [facility] OR   Surgeon:  Jovita Gamma, MD      DISCUSSION: 47 yo male former smoker. Pertinent hx includes Depression, Chronic pain, Morbid obesity BMI 47, OSA not on CPAP, Urethral stricture (reports he was told would need pediatric cath if one needed to be placed), Migraines, HTN.  Pt was hospitalized 2014 for eval of CP. Per Dr. Rosezella [place] note 06/13/2013 "He ruled out for a MI. In the setting of NTG responsive chest pain and cardiac risk factors a cath was performed for definitive evaluation. This showed no CAD and chest pain was felt to be non-cardiac in nature. He was instructed to contact his PCP to discuss further evaluation of non cardiac chest pain."  Anticipate he can proceed as planned barring acute status change.  VS: BP 109/67   Pulse 95   Temp 37.1 C   Resp 20   Ht 6\' 1"  (1.854 m)   Wt (!) 161.5 kg   SpO2 96%   BMI 46.97 kg/m   PROVIDERS: Lucille Passy, MD is PCP  LABS: Labs reviewed: Acceptable for surgery. (all labs ordered are listed, but only abnormal results are displayed)  Labs Reviewed  SURGICAL PCR SCREEN - Abnormal; Notable for the following components:      Result Value   Staphylococcus aureus POSITIVE (*)    All other components within normal limits  CBC - Abnormal; Notable for the following components:   WBC 12.3 (*)    MCH 24.3 (*)    MCHC 29.9 (*)    All other components within normal limits  BASIC METABOLIC PANEL    IMAGES: CHEST  2 VIEW 08/05/2014:  COMPARISON:  06/12/2013  FINDINGS: The heart size and mediastinal contours are within normal limits. Both lungs are clear. The visualized skeletal structures  are unremarkable.  IMPRESSION: No active cardiopulmonary disease.   EKG: 04/27/2018: Normal sinus rhythm. Rate 80.  CV: Cath 06/13/2013: Coronary angiography:   Coronary dominance: Right  Left mainstem:   Normal  Left anterior descending (LAD):   Large vessel wrapping the apex.  Normal.  D1 small and normal.   Left circumflex (LCx):  Large AV groove with mild luminal irregularities.  MOM large and normal.  PL large and normal.    Right coronary artery (RCA):  Large dominant normal.  PDA small and normal  Left ventriculography: Left ventricular systolic function is normal, LVEF is estimated at 65, there is no significant mitral regurgitation   Final Conclusions:  Minimal coronary plaque.  NL LV function.   Recommendations:   No further cardiac work up.  Follow up with primary MD to evaluate nonanginal pain.    TTE 06/12/2013: Study Conclusions  - Left ventricle: Technically severely limited study. However, contrast images are good. There is normal LV function with EF65%. - Left atrium: The atrium was mildly dilated. - Right ventricle: Not able to assess RV function due to poor visualization. Not able to assess RV size due to poor visualization. Past Medical History:  Diagnosis Date  . Anxiety   . Chronic fatigue   . Chronic pain   . Dyslipidemia   . History of kidney stones   .  HTN (hypertension)   . Lumbar disc disease with radiculopathy    sees Dr. Rita Ohara  . Major depressive disorder, recurrent episode, moderate ([place])   . Migraine headache   . Morbid obesity ([place])   . Sleep apnea    does not use cpap  . Tachycardia    a. 07/2012 - placed on BB. CT angio neg.  Marland Kitchen Urethral stricture    Was told he would need pediatric cath if ever had urinary cath    Past Surgical History:  Procedure Laterality Date  . CARDIAC CATHETERIZATION  05/2013   minimal coronary plaque with normal LV fxn (hochrein)  . fatty tumor removal    . LEFT HEART  CATHETERIZATION WITH CORONARY ANGIOGRAM N/A 06/13/2013   Procedure: LEFT HEART CATHETERIZATION WITH CORONARY ANGIOGRAM;  Surgeon: Minus Breeding, MD;  Location: [facility] CATH LAB;  Service: Cardiovascular;  Laterality: N/A;  . LUMBAR FUSION  09/2008   Dr. Sherwood Gambler    MEDICATIONS: . gabapentin (NEURONTIN) 300 MG capsule  . lisinopril (PRINIVIL,ZESTRIL) 20 MG tablet  . metoprolol succinate (TOPROL-XL) 25 MG 24 hr tablet  . morphine (MS CONTIN) 30 MG 12 hr tablet  . Multiple Vitamin (MULTIVITAMIN WITH MINERALS) TABS tablet  . naproxen sodium (ANAPROX) 220 MG tablet  . PARoxetine (PAXIL) 20 MG tablet  . Pseudoephedrine-APAP-DM (DAYQUIL PO)  . SUMAtriptan (IMITREX) 50 MG tablet  . tiZANidine (ZANAFLEX) 4 MG tablet   No current facility-administered medications for this encounter.      Wynonia Musty [facility] Short Stay Center/Anesthesiology Phone [phone] 04/28/2018 12:05 PM

## 2018-04-29 ENCOUNTER — Other Ambulatory Visit: Payer: Self-pay | Admitting: Family Medicine

## 2018-05-03 ENCOUNTER — Encounter (HOSPITAL_COMMUNITY): Payer: Self-pay | Admitting: Anesthesiology

## 2018-05-03 MED ORDER — DEXTROSE 5 % IV SOLN
3.0000 g | INTRAVENOUS | Status: AC
  Administered 2018-05-04: 3 g via INTRAVENOUS
  Filled 2018-05-03: qty 3

## 2018-05-03 NOTE — Anesthesia Preprocedure Evaluation (Addendum)
Anesthesia Evaluation  Patient identified by MRN, date of birth, ID band Patient awake    Reviewed: Allergy & Precautions, NPO status , Patient's Chart, lab work & pertinent test results  History of Anesthesia Complications Negative for: history of anesthetic complications  Airway Mallampati: II  TM Distance: >3 FB Neck ROM: Full    Dental no notable dental hx. (+) Dental Advisory Given   Pulmonary sleep apnea , former smoker,    Pulmonary exam normal        Cardiovascular hypertension, Pt. on home beta blockers and Pt. on medications Normal cardiovascular exam  Study Conclusions  - Left ventricle: Technically severely limited study. However, contrast images are good. There is normal LV function with EF65%. - Left atrium: The atrium was mildly dilated. - Right ventricle: Not able to assess RV function due to poor visualization. Not able to assess RV size due to poor visualization.   Neuro/Psych PSYCHIATRIC DISORDERS Anxiety Depression    GI/Hepatic negative GI ROS, Neg liver ROS,   Endo/Other  Morbid obesity  Renal/GU negative Renal ROS     Musculoskeletal negative musculoskeletal ROS (+)   Abdominal   Peds  Hematology negative hematology ROS (+)   Anesthesia Other Findings Day of surgery medications reviewed with the patient.  Reproductive/Obstetrics                            Anesthesia Physical Anesthesia Plan  ASA: III  Anesthesia Plan: General   Post-op Pain Management:    Induction: Intravenous  PONV Risk Score and Plan: 3 and Ondansetron, Dexamethasone and Scopolamine patch - Pre-op  Airway Management Planned: Oral ETT  Additional Equipment:   Intra-op Plan:   Post-operative Plan: Extubation in OR  Informed Consent: I have reviewed the patients History and Physical, chart, labs and discussed the procedure including the risks, benefits and alternatives for  the proposed anesthesia with the patient or authorized representative who has indicated his/her understanding and acceptance.   Dental advisory given  Plan Discussed with: Anesthesiologist and CRNA  Anesthesia Plan Comments:        Anesthesia Quick Evaluation

## 2018-05-04 ENCOUNTER — Other Ambulatory Visit: Payer: Self-pay

## 2018-05-04 ENCOUNTER — Encounter (HOSPITAL_COMMUNITY)
Admission: RE | Disposition: A | Discharge status: Home / Self Care | Payer: Self-pay | Source: Ambulatory Visit | Attending: Neurosurgery

## 2018-05-04 ENCOUNTER — Observation Stay (HOSPITAL_COMMUNITY)
Admission: RE | Admit: 2018-05-04 | Discharge: 2018-05-05 | Disposition: A | Discharge status: Home / Self Care | Payer: Medicare Other | Source: Ambulatory Visit | Attending: Neurosurgery | Admitting: Neurosurgery

## 2018-05-04 ENCOUNTER — Ambulatory Visit (HOSPITAL_COMMUNITY): Payer: Medicare Other | Admitting: Physician Assistant

## 2018-05-04 ENCOUNTER — Ambulatory Visit (HOSPITAL_COMMUNITY): Payer: Medicare Other | Admitting: Anesthesiology

## 2018-05-04 ENCOUNTER — Encounter (HOSPITAL_COMMUNITY): Payer: Self-pay

## 2018-05-04 ENCOUNTER — Ambulatory Visit (HOSPITAL_COMMUNITY): Payer: Medicare Other

## 2018-05-04 DIAGNOSIS — Z419 Encounter for procedure for purposes other than remedying health state, unspecified: Secondary | ICD-10-CM

## 2018-05-04 DIAGNOSIS — M48061 Spinal stenosis, lumbar region without neurogenic claudication: Secondary | ICD-10-CM | POA: Insufficient documentation

## 2018-05-04 DIAGNOSIS — G4733 Obstructive sleep apnea (adult) (pediatric): Secondary | ICD-10-CM | POA: Diagnosis not present

## 2018-05-04 DIAGNOSIS — I10 Essential (primary) hypertension: Secondary | ICD-10-CM | POA: Diagnosis not present

## 2018-05-04 DIAGNOSIS — M19042 Primary osteoarthritis, left hand: Secondary | ICD-10-CM | POA: Diagnosis not present

## 2018-05-04 DIAGNOSIS — M19041 Primary osteoarthritis, right hand: Secondary | ICD-10-CM | POA: Diagnosis not present

## 2018-05-04 DIAGNOSIS — Z79899 Other long term (current) drug therapy: Secondary | ICD-10-CM | POA: Diagnosis not present

## 2018-05-04 DIAGNOSIS — Z6841 Body Mass Index (BMI) 40.0 and over, adult: Secondary | ICD-10-CM | POA: Diagnosis not present

## 2018-05-04 DIAGNOSIS — M5116 Intervertebral disc disorders with radiculopathy, lumbar region: Principal | ICD-10-CM | POA: Insufficient documentation

## 2018-05-04 DIAGNOSIS — F329 Major depressive disorder, single episode, unspecified: Secondary | ICD-10-CM | POA: Diagnosis not present

## 2018-05-04 DIAGNOSIS — M5126 Other intervertebral disc displacement, lumbar region: Secondary | ICD-10-CM | POA: Diagnosis present

## 2018-05-04 DIAGNOSIS — Z87891 Personal history of nicotine dependence: Secondary | ICD-10-CM | POA: Insufficient documentation

## 2018-05-04 DIAGNOSIS — M19071 Primary osteoarthritis, right ankle and foot: Secondary | ICD-10-CM | POA: Insufficient documentation

## 2018-05-04 DIAGNOSIS — F419 Anxiety disorder, unspecified: Secondary | ICD-10-CM | POA: Diagnosis not present

## 2018-05-04 DIAGNOSIS — M5416 Radiculopathy, lumbar region: Secondary | ICD-10-CM | POA: Diagnosis not present

## 2018-05-04 DIAGNOSIS — M19072 Primary osteoarthritis, left ankle and foot: Secondary | ICD-10-CM | POA: Diagnosis not present

## 2018-05-04 DIAGNOSIS — M4726 Other spondylosis with radiculopathy, lumbar region: Secondary | ICD-10-CM | POA: Diagnosis not present

## 2018-05-04 DIAGNOSIS — M48062 Spinal stenosis, lumbar region with neurogenic claudication: Secondary | ICD-10-CM | POA: Diagnosis not present

## 2018-05-04 DIAGNOSIS — Z8249 Family history of ischemic heart disease and other diseases of the circulatory system: Secondary | ICD-10-CM | POA: Insufficient documentation

## 2018-05-04 DIAGNOSIS — Z981 Arthrodesis status: Secondary | ICD-10-CM | POA: Diagnosis not present

## 2018-05-04 HISTORY — PX: LUMBAR LAMINECTOMY/DECOMPRESSION MICRODISCECTOMY: SHX5026

## 2018-05-04 SURGERY — LUMBAR LAMINECTOMY/DECOMPRESSION MICRODISCECTOMY 1 LEVEL
Anesthesia: General | Site: Spine Lumbar | Laterality: Left

## 2018-05-04 MED ORDER — METHYLPREDNISOLONE ACETATE 80 MG/ML IJ SUSP
INTRAMUSCULAR | Status: AC
  Filled 2018-05-04: qty 1

## 2018-05-04 MED ORDER — TIZANIDINE HCL 4 MG PO TABS
4.0000 mg | ORAL_TABLET | Freq: Two times a day (BID) | ORAL | Status: DC
  Administered 2018-05-04 – 2018-05-05 (×2): 4 mg via ORAL
  Filled 2018-05-04 (×2): qty 1

## 2018-05-04 MED ORDER — DEXAMETHASONE SODIUM PHOSPHATE 10 MG/ML IJ SOLN
INTRAMUSCULAR | Status: DC | PRN
  Administered 2018-05-04: 10 mg via INTRAVENOUS

## 2018-05-04 MED ORDER — KETOROLAC TROMETHAMINE 30 MG/ML IJ SOLN
30.0000 mg | Freq: Once | INTRAMUSCULAR | Status: AC
  Administered 2018-05-04: 30 mg via INTRAVENOUS

## 2018-05-04 MED ORDER — FENTANYL CITRATE (PF) 100 MCG/2ML IJ SOLN
INTRAMUSCULAR | Status: AC
  Filled 2018-05-04: qty 2

## 2018-05-04 MED ORDER — SUCCINYLCHOLINE CHLORIDE 200 MG/10ML IV SOSY
PREFILLED_SYRINGE | INTRAVENOUS | Status: DC | PRN
  Administered 2018-05-04: 40 mg via INTRAVENOUS

## 2018-05-04 MED ORDER — SODIUM CHLORIDE 0.9 % IJ SOLN
INTRAMUSCULAR | Status: AC
  Filled 2018-05-04: qty 10

## 2018-05-04 MED ORDER — PHENYLEPHRINE 40 MCG/ML (10ML) SYRINGE FOR IV PUSH (FOR BLOOD PRESSURE SUPPORT)
PREFILLED_SYRINGE | INTRAVENOUS | Status: AC
  Filled 2018-05-04: qty 10

## 2018-05-04 MED ORDER — BISACODYL 10 MG RE SUPP: 10.0000 mg | Freq: Every day | RECTAL | Status: DC | PRN

## 2018-05-04 MED ORDER — ARTIFICIAL TEARS OPHTHALMIC OINT
TOPICAL_OINTMENT | OPHTHALMIC | Status: AC
  Filled 2018-05-04: qty 3.5

## 2018-05-04 MED ORDER — HYDROCODONE-ACETAMINOPHEN 5-325 MG PO TABS
1.0000 | ORAL_TABLET | ORAL | Status: DC | PRN
  Administered 2018-05-04: 1 via ORAL
  Administered 2018-05-04: 2 via ORAL
  Administered 2018-05-05 (×3): 1 via ORAL
  Filled 2018-05-04: qty 2
  Filled 2018-05-04 (×3): qty 1
  Filled 2018-05-04: qty 2

## 2018-05-04 MED ORDER — ROCURONIUM BROMIDE 50 MG/5ML IV SOSY
PREFILLED_SYRINGE | INTRAVENOUS | Status: DC | PRN
  Administered 2018-05-04 (×2): 50 mg via INTRAVENOUS

## 2018-05-04 MED ORDER — MORPHINE SULFATE ER 30 MG PO TBCR: 30.0000 mg | EXTENDED_RELEASE_TABLET | Freq: Every day | ORAL | Status: DC

## 2018-05-04 MED ORDER — THROMBIN 5000 UNITS EX SOLR
CUTANEOUS | Status: AC
  Filled 2018-05-04: qty 15000

## 2018-05-04 MED ORDER — METHYLPREDNISOLONE ACETATE 80 MG/ML IJ SUSP
INTRAMUSCULAR | Status: DC | PRN
  Administered 2018-05-04: 80 mg

## 2018-05-04 MED ORDER — BUPIVACAINE HCL (PF) 0.5 % IJ SOLN
INTRAMUSCULAR | Status: AC
  Filled 2018-05-04: qty 30

## 2018-05-04 MED ORDER — PROMETHAZINE HCL 25 MG/ML IJ SOLN: 6.2500 mg | INTRAMUSCULAR | Status: DC | PRN

## 2018-05-04 MED ORDER — MIDAZOLAM HCL 2 MG/2ML IJ SOLN
INTRAMUSCULAR | Status: AC
  Filled 2018-05-04: qty 2

## 2018-05-04 MED ORDER — CHLORHEXIDINE GLUCONATE CLOTH 2 % EX PADS: 6.0000 | MEDICATED_PAD | Freq: Once | CUTANEOUS | Status: DC

## 2018-05-04 MED ORDER — GABAPENTIN 300 MG PO CAPS
600.0000 mg | ORAL_CAPSULE | Freq: Every day | ORAL | Status: DC
  Administered 2018-05-04: 600 mg via ORAL
  Filled 2018-05-04: qty 2

## 2018-05-04 MED ORDER — FENTANYL CITRATE (PF) 250 MCG/5ML IJ SOLN
INTRAMUSCULAR | Status: AC
  Filled 2018-05-04: qty 5

## 2018-05-04 MED ORDER — DEXAMETHASONE SODIUM PHOSPHATE 10 MG/ML IJ SOLN
INTRAMUSCULAR | Status: AC
  Filled 2018-05-04: qty 1

## 2018-05-04 MED ORDER — HYDROXYZINE HCL 25 MG PO TABS: 50.0000 mg | ORAL_TABLET | ORAL | Status: DC | PRN

## 2018-05-04 MED ORDER — ONDANSETRON HCL 4 MG/2ML IJ SOLN
INTRAMUSCULAR | Status: AC
  Filled 2018-05-04: qty 2

## 2018-05-04 MED ORDER — BUPIVACAINE HCL (PF) 0.5 % IJ SOLN
INTRAMUSCULAR | Status: DC | PRN
  Administered 2018-05-04: 15 mL

## 2018-05-04 MED ORDER — PROPOFOL 10 MG/ML IV BOLUS
INTRAVENOUS | Status: DC | PRN
  Administered 2018-05-04: 200 mg via INTRAVENOUS

## 2018-05-04 MED ORDER — LIDOCAINE 2% (20 MG/ML) 5 ML SYRINGE
INTRAMUSCULAR | Status: AC
  Filled 2018-05-04: qty 5

## 2018-05-04 MED ORDER — LIDOCAINE 2% (20 MG/ML) 5 ML SYRINGE
INTRAMUSCULAR | Status: DC | PRN
  Administered 2018-05-04: 100 mg via INTRAVENOUS

## 2018-05-04 MED ORDER — LACTATED RINGERS IV SOLN
INTRAVENOUS | Status: DC | PRN
  Administered 2018-05-04 (×2): via INTRAVENOUS

## 2018-05-04 MED ORDER — PHENOL 1.4 % MT LIQD: 1.0000 | OROMUCOSAL | Status: DC | PRN

## 2018-05-04 MED ORDER — MENTHOL 3 MG MT LOZG: 1.0000 | LOZENGE | OROMUCOSAL | Status: DC | PRN

## 2018-05-04 MED ORDER — LISINOPRIL 20 MG PO TABS
20.0000 mg | ORAL_TABLET | Freq: Every day | ORAL | Status: DC
  Administered 2018-05-04 – 2018-05-05 (×2): 20 mg via ORAL
  Filled 2018-05-04 (×2): qty 1

## 2018-05-04 MED ORDER — THROMBIN 5000 UNITS EX SOLR
CUTANEOUS | Status: DC | PRN
  Administered 2018-05-04 (×2): 5000 [IU] via TOPICAL

## 2018-05-04 MED ORDER — SODIUM CHLORIDE 0.9 % IV SOLN: 250.0000 mL | INTRAVENOUS | Status: DC

## 2018-05-04 MED ORDER — FLEET ENEMA 7-19 GM/118ML RE ENEM: 1.0000 | ENEMA | Freq: Once | RECTAL | Status: DC | PRN

## 2018-05-04 MED ORDER — CYCLOBENZAPRINE HCL 5 MG PO TABS
5.0000 mg | ORAL_TABLET | Freq: Three times a day (TID) | ORAL | Status: DC | PRN
  Administered 2018-05-04: 5 mg via ORAL
  Filled 2018-05-04: qty 1

## 2018-05-04 MED ORDER — MAGNESIUM HYDROXIDE 400 MG/5ML PO SUSP: 30.0000 mL | Freq: Every day | ORAL | Status: DC | PRN

## 2018-05-04 MED ORDER — ALUM & MAG HYDROXIDE-SIMETH 200-200-20 MG/5ML PO SUSP: 30.0000 mL | Freq: Four times a day (QID) | ORAL | Status: DC | PRN

## 2018-05-04 MED ORDER — ACETAMINOPHEN 650 MG RE SUPP: 650.0000 mg | RECTAL | Status: DC | PRN

## 2018-05-04 MED ORDER — PROPOFOL 10 MG/ML IV BOLUS
INTRAVENOUS | Status: AC
  Filled 2018-05-04: qty 40

## 2018-05-04 MED ORDER — ROCURONIUM BROMIDE 50 MG/5ML IV SOSY
PREFILLED_SYRINGE | INTRAVENOUS | Status: AC
  Filled 2018-05-04: qty 5

## 2018-05-04 MED ORDER — GLYCOPYRROLATE PF 0.2 MG/ML IJ SOSY
PREFILLED_SYRINGE | INTRAMUSCULAR | Status: AC
  Filled 2018-05-04: qty 1

## 2018-05-04 MED ORDER — PAROXETINE HCL 20 MG PO TABS
20.0000 mg | ORAL_TABLET | Freq: Every day | ORAL | Status: DC
  Administered 2018-05-05: 20 mg via ORAL
  Filled 2018-05-04: qty 1

## 2018-05-04 MED ORDER — ACETAMINOPHEN 10 MG/ML IV SOLN
INTRAVENOUS | Status: DC | PRN
  Administered 2018-05-04: 1000 mg via INTRAVENOUS

## 2018-05-04 MED ORDER — LIDOCAINE-EPINEPHRINE 1 %-1:100000 IJ SOLN
INTRAMUSCULAR | Status: DC | PRN
  Administered 2018-05-04: 15 mL

## 2018-05-04 MED ORDER — NEOSTIGMINE METHYLSULFATE 3 MG/3ML IV SOSY
PREFILLED_SYRINGE | INTRAVENOUS | Status: AC
  Filled 2018-05-04: qty 3

## 2018-05-04 MED ORDER — SUCCINYLCHOLINE CHLORIDE 200 MG/10ML IV SOSY
PREFILLED_SYRINGE | INTRAVENOUS | Status: AC
  Filled 2018-05-04: qty 10

## 2018-05-04 MED ORDER — SODIUM CHLORIDE 0.9 % IV SOLN
INTRAVENOUS | Status: DC | PRN
  Administered 2018-05-04: 500 mL

## 2018-05-04 MED ORDER — ONDANSETRON HCL 4 MG/2ML IJ SOLN
INTRAMUSCULAR | Status: DC | PRN
  Administered 2018-05-04: 4 mg via INTRAVENOUS

## 2018-05-04 MED ORDER — SUGAMMADEX SODIUM 200 MG/2ML IV SOLN
INTRAVENOUS | Status: DC | PRN
  Administered 2018-05-04: 323 mg via INTRAVENOUS

## 2018-05-04 MED ORDER — ACETAMINOPHEN 10 MG/ML IV SOLN
INTRAVENOUS | Status: AC
  Filled 2018-05-04: qty 100

## 2018-05-04 MED ORDER — DEXTROSE 5 % IV SOLN
3.0000 g | Freq: Three times a day (TID) | INTRAVENOUS | Status: AC
  Administered 2018-05-04 (×2): 3 g via INTRAVENOUS
  Filled 2018-05-04 (×2): qty 3

## 2018-05-04 MED ORDER — METOPROLOL TARTRATE 5 MG/5ML IV SOLN
INTRAVENOUS | Status: DC | PRN
  Administered 2018-05-04: 2 mg via INTRAVENOUS
  Administered 2018-05-04: 3 mg via INTRAVENOUS

## 2018-05-04 MED ORDER — METOPROLOL SUCCINATE 12.5 MG HALF TABLET
12.5000 mg | ORAL_TABLET | Freq: Every day | ORAL | Status: DC
  Administered 2018-05-05: 12.5 mg via ORAL
  Filled 2018-05-04 (×2): qty 1

## 2018-05-04 MED ORDER — KETOROLAC TROMETHAMINE 30 MG/ML IJ SOLN
30.0000 mg | Freq: Four times a day (QID) | INTRAMUSCULAR | Status: DC
  Administered 2018-05-04 – 2018-05-05 (×3): 30 mg via INTRAVENOUS
  Filled 2018-05-04 (×3): qty 1

## 2018-05-04 MED ORDER — KCL IN DEXTROSE-NACL 20-5-0.45 MEQ/L-%-% IV SOLN: INTRAVENOUS | Status: DC

## 2018-05-04 MED ORDER — ACETAMINOPHEN 325 MG PO TABS: 650.0000 mg | ORAL_TABLET | ORAL | Status: DC | PRN

## 2018-05-04 MED ORDER — EPHEDRINE 5 MG/ML INJ
INTRAVENOUS | Status: AC
  Filled 2018-05-04: qty 10

## 2018-05-04 MED ORDER — HYDROMORPHONE HCL 1 MG/ML IJ SOLN: 0.2500 mg | INTRAMUSCULAR | Status: DC | PRN

## 2018-05-04 MED ORDER — MORPHINE SULFATE ER 30 MG PO TBCR
30.0000 mg | EXTENDED_RELEASE_TABLET | Freq: Two times a day (BID) | ORAL | Status: DC
  Administered 2018-05-04 – 2018-05-05 (×2): 30 mg via ORAL
  Filled 2018-05-04 (×2): qty 1

## 2018-05-04 MED ORDER — KETOROLAC TROMETHAMINE 30 MG/ML IJ SOLN
INTRAMUSCULAR | Status: AC
  Filled 2018-05-04: qty 1

## 2018-05-04 MED ORDER — MORPHINE SULFATE (PF) 4 MG/ML IV SOLN: 4.0000 mg | INTRAVENOUS | Status: DC | PRN

## 2018-05-04 MED ORDER — DEXMEDETOMIDINE HCL IN NACL 200 MCG/50ML IV SOLN
INTRAVENOUS | Status: DC | PRN
  Administered 2018-05-04 (×3): 8 ug via INTRAVENOUS

## 2018-05-04 MED ORDER — HYDROXYZINE HCL 50 MG/ML IM SOLN: 50.0000 mg | INTRAMUSCULAR | Status: DC | PRN

## 2018-05-04 MED ORDER — PROPOFOL 10 MG/ML IV BOLUS
INTRAVENOUS | Status: AC
  Filled 2018-05-04: qty 20

## 2018-05-04 MED ORDER — MIDAZOLAM HCL 5 MG/5ML IJ SOLN
INTRAMUSCULAR | Status: DC | PRN
  Administered 2018-05-04: 2 mg via INTRAVENOUS

## 2018-05-04 MED ORDER — DEXMEDETOMIDINE HCL IN NACL 200 MCG/50ML IV SOLN
INTRAVENOUS | Status: AC
  Filled 2018-05-04: qty 50

## 2018-05-04 MED ORDER — SODIUM CHLORIDE 0.9% FLUSH: 3.0000 mL | Freq: Two times a day (BID) | INTRAVENOUS | Status: DC

## 2018-05-04 MED ORDER — SODIUM CHLORIDE 0.9 % IV SOLN
INTRAVENOUS | Status: DC | PRN
  Administered 2018-05-04: 20 ug/min via INTRAVENOUS

## 2018-05-04 MED ORDER — METOPROLOL TARTRATE 5 MG/5ML IV SOLN
INTRAVENOUS | Status: AC
  Filled 2018-05-04: qty 5

## 2018-05-04 MED ORDER — ADULT MULTIVITAMIN W/MINERALS CH
1.0000 | ORAL_TABLET | Freq: Every day | ORAL | Status: DC
  Administered 2018-05-05: 1 via ORAL
  Filled 2018-05-04: qty 1

## 2018-05-04 MED ORDER — SODIUM CHLORIDE 0.9% FLUSH: 3.0000 mL | INTRAVENOUS | Status: DC | PRN

## 2018-05-04 MED ORDER — LIDOCAINE-EPINEPHRINE 1 %-1:100000 IJ SOLN
INTRAMUSCULAR | Status: AC
  Filled 2018-05-04: qty 1

## 2018-05-04 MED ORDER — THROMBIN 5000 UNITS EX SOLR
OROMUCOSAL | Status: DC | PRN
  Administered 2018-05-04: 5 mL via TOPICAL

## 2018-05-04 MED ORDER — FENTANYL CITRATE (PF) 100 MCG/2ML IJ SOLN
INTRAMUSCULAR | Status: DC | PRN
  Administered 2018-05-04: 100 ug via INTRAVENOUS

## 2018-05-04 MED ORDER — FENTANYL CITRATE (PF) 250 MCG/5ML IJ SOLN
INTRAMUSCULAR | Status: DC | PRN
  Administered 2018-05-04 (×3): 50 ug via INTRAVENOUS
  Administered 2018-05-04: 100 ug via INTRAVENOUS
  Administered 2018-05-04 (×3): 50 ug via INTRAVENOUS

## 2018-05-04 MED ORDER — GABAPENTIN 300 MG PO CAPS: 300.0000 mg | ORAL_CAPSULE | ORAL | Status: DC

## 2018-05-04 MED ORDER — 0.9 % SODIUM CHLORIDE (POUR BTL) OPTIME
TOPICAL | Status: DC | PRN
  Administered 2018-05-04: 1000 mL

## 2018-05-04 MED ORDER — GABAPENTIN 300 MG PO CAPS
300.0000 mg | ORAL_CAPSULE | Freq: Every day | ORAL | Status: DC
  Administered 2018-05-05: 300 mg via ORAL
  Filled 2018-05-04 (×2): qty 1

## 2018-05-04 SURGICAL SUPPLY — 63 items
BAG DECANTER FOR FLEXI CONT (MISCELLANEOUS) ×2 IMPLANT
BENZOIN TINCTURE PRP APPL 2/3 (GAUZE/BANDAGES/DRESSINGS) IMPLANT
BLADE CLIPPER SURG (BLADE) ×2 IMPLANT
BUR ACRON 5.0MM COATED (BURR) ×2 IMPLANT
BUR MATCHSTICK NEURO 3.0 LAGG (BURR) ×2 IMPLANT
CANISTER SUCT 3000ML PPV (MISCELLANEOUS) ×2 IMPLANT
CARTRIDGE OIL MAESTRO DRILL (MISCELLANEOUS) ×1 IMPLANT
COVER WAND RF STERILE (DRAPES) IMPLANT
DECANTER SPIKE VIAL GLASS SM (MISCELLANEOUS) ×2 IMPLANT
DERMABOND ADVANCED (GAUZE/BANDAGES/DRESSINGS) ×2
DERMABOND ADVANCED .7 DNX12 (GAUZE/BANDAGES/DRESSINGS) ×2 IMPLANT
DIFFUSER DRILL AIR PNEUMATIC (MISCELLANEOUS) ×2 IMPLANT
DRAPE LAPAROTOMY 100X72X124 (DRAPES) ×2 IMPLANT
DRAPE MICROSCOPE LEICA (MISCELLANEOUS) ×2 IMPLANT
DRAPE POUCH INSTRU U-SHP 10X18 (DRAPES) ×2 IMPLANT
ELECT BLADE 4.0 EZ CLEAN MEGAD (MISCELLANEOUS) ×2
ELECT REM PT RETURN 9FT ADLT (ELECTROSURGICAL) ×2
ELECTRODE BLDE 4.0 EZ CLN MEGD (MISCELLANEOUS) ×1 IMPLANT
ELECTRODE REM PT RTRN 9FT ADLT (ELECTROSURGICAL) ×1 IMPLANT
GAUZE 4X4 16PLY RFD (DISPOSABLE) IMPLANT
GAUZE SPONGE 4X4 12PLY STRL (GAUZE/BANDAGES/DRESSINGS) IMPLANT
GAUZE SPONGE 4X4 12PLY STRL LF (GAUZE/BANDAGES/DRESSINGS) ×2 IMPLANT
GLOVE BIOGEL PI IND STRL 7.0 (GLOVE) ×2 IMPLANT
GLOVE BIOGEL PI IND STRL 7.5 (GLOVE) ×3 IMPLANT
GLOVE BIOGEL PI IND STRL 8 (GLOVE) ×1 IMPLANT
GLOVE BIOGEL PI INDICATOR 7.0 (GLOVE) ×2
GLOVE BIOGEL PI INDICATOR 7.5 (GLOVE) ×3
GLOVE BIOGEL PI INDICATOR 8 (GLOVE) ×1
GLOVE ECLIPSE 7.5 STRL STRAW (GLOVE) ×4 IMPLANT
GLOVE EXAM NITRILE LRG STRL (GLOVE) IMPLANT
GLOVE EXAM NITRILE XL STR (GLOVE) IMPLANT
GLOVE EXAM NITRILE XS STR PU (GLOVE) IMPLANT
GOWN STRL REUS W/ TWL LRG LVL3 (GOWN DISPOSABLE) ×2 IMPLANT
GOWN STRL REUS W/ TWL XL LVL3 (GOWN DISPOSABLE) ×1 IMPLANT
GOWN STRL REUS W/TWL 2XL LVL3 (GOWN DISPOSABLE) IMPLANT
GOWN STRL REUS W/TWL LRG LVL3 (GOWN DISPOSABLE) ×2
GOWN STRL REUS W/TWL XL LVL3 (GOWN DISPOSABLE) ×1
HEMOSTAT POWDER KIT SURGIFOAM (HEMOSTASIS) ×2 IMPLANT
KIT BASIN OR (CUSTOM PROCEDURE TRAY) ×2 IMPLANT
KIT TURNOVER KIT B (KITS) ×2 IMPLANT
NEEDLE HYPO 18GX1.5 BLUNT FILL (NEEDLE) ×2 IMPLANT
NEEDLE SPNL 18GX3.5 QUINCKE PK (NEEDLE) ×4 IMPLANT
NEEDLE SPNL 22GX3.5 QUINCKE BK (NEEDLE) ×2 IMPLANT
NS IRRIG 1000ML POUR BTL (IV SOLUTION) ×2 IMPLANT
OIL CARTRIDGE MAESTRO DRILL (MISCELLANEOUS) ×2
PACK LAMINECTOMY NEURO (CUSTOM PROCEDURE TRAY) ×2 IMPLANT
PAD ARMBOARD 7.5X6 YLW CONV (MISCELLANEOUS) ×6 IMPLANT
PATTIES SURGICAL .5 X.5 (GAUZE/BANDAGES/DRESSINGS) ×2 IMPLANT
PATTIES SURGICAL .5 X1 (DISPOSABLE) ×2 IMPLANT
RUBBERBAND STERILE (MISCELLANEOUS) ×4 IMPLANT
SPONGE LAP 4X18 RFD (DISPOSABLE) IMPLANT
SPONGE SURGIFOAM ABS GEL SZ50 (HEMOSTASIS) ×2 IMPLANT
STRIP CLOSURE SKIN 1/2X4 (GAUZE/BANDAGES/DRESSINGS) IMPLANT
SUT PROLENE 6 0 BV (SUTURE) IMPLANT
SUT VIC AB 1 CT1 18XBRD ANBCTR (SUTURE) ×2 IMPLANT
SUT VIC AB 1 CT1 8-18 (SUTURE) ×2
SUT VIC AB 2-0 CP2 18 (SUTURE) ×4 IMPLANT
SUT VIC AB 3-0 SH 8-18 (SUTURE) ×2 IMPLANT
SYR 5ML LL (SYRINGE) ×2 IMPLANT
TAPE CLOTH SURG 4X10 WHT LF (GAUZE/BANDAGES/DRESSINGS) ×2 IMPLANT
TOWEL GREEN STERILE (TOWEL DISPOSABLE) ×2 IMPLANT
TOWEL GREEN STERILE FF (TOWEL DISPOSABLE) ×2 IMPLANT
WATER STERILE IRR 1000ML POUR (IV SOLUTION) ×2 IMPLANT

## 2018-05-04 NOTE — Anesthesia Postprocedure Evaluation (Signed)
Anesthesia Post Note  Patient: Richard Mcguire  Procedure(s) Performed: LEFT LUMBAR TWO- LUMBAR THREE  LAMINOTOMY, LEFT LUMBAR TWO-THREE MEDIAL FACETECTOMY AND FORAMINOTOMY (Left Spine Lumbar)     Patient location during evaluation: PACU Anesthesia Type: General Level of consciousness: sedated Pain management: pain level controlled Vital Signs Assessment: post-procedure vital signs reviewed and stable Respiratory status: spontaneous breathing and respiratory function stable Cardiovascular status: stable Postop Assessment: no apparent nausea or vomiting Anesthetic complications: no    Last Vitals:  Vitals:   05/04/18 1130 05/04/18 1145  BP: 120/83 112/69  Pulse: 98 96  Resp: 10 16  Temp:    SpO2: 91% 96%    Last Pain:  Vitals:   05/04/18 1130  TempSrc:   PainSc: Asleep                 Anguel Delapena DANIEL

## 2018-05-04 NOTE — Progress Notes (Addendum)
Pt has psoriasis on right arm that appears to be dark red and open in areas.  Pt stated he must have scratched it during his sleep.  Dr. Sherwood Gambler made aware.

## 2018-05-04 NOTE — Transfer of Care (Signed)
Immediate Anesthesia Transfer of Care Note  Patient: Richard Mcguire  Procedure(s) Performed: LEFT LUMBAR TWO- LUMBAR THREE  LAMINOTOMY, LEFT LUMBAR TWO-THREE MEDIAL FACETECTOMY AND FORAMINOTOMY (Left Spine Lumbar)  Patient Location: PACU  Anesthesia Type:General  Level of Consciousness: awake, alert , drowsy and patient cooperative  Airway & Oxygen Therapy: Patient Spontanous Breathing and Patient connected to nasal cannula oxygen  Post-op Assessment: Report given to RN, Post -op Vital signs reviewed and stable and Patient moving all extremities X 4  Post vital signs: Reviewed and stable  Last Vitals:  Vitals Value Taken Time  BP 132/93 05/04/2018 11:01 AM  Temp    Pulse 107 05/04/2018 11:02 AM  Resp 16 05/04/2018 11:02 AM  SpO2 97 % 05/04/2018 11:02 AM  Vitals shown include unvalidated device data.  Last Pain:  Vitals:   05/04/18 0607  TempSrc:   PainSc: 4       Patients Stated Pain Goal: 2 (82/95/62 1308)  Complications: No apparent anesthesia complications

## 2018-05-04 NOTE — Op Note (Addendum)
05/04/2018  10:51 AM  PATIENT:  Richard Mcguire  47 y.o. male  PRE-OPERATIVE DIAGNOSIS: Left L2-3 lumbar disc herniation with free fragment migrated caudally behind the body of L3 on the left side; lumbar degenerative disease; lumbar spondylosis; lumbar stenosis; lumbar radiculopathy  POST-OPERATIVE DIAGNOSIS: Lumbar stenosis; lumbar radiculopathy; lumbar degenerative disc disease; lumbar spondylosis  PROCEDURE:  Procedure(s): Left L2 inferior hemilaminotomy and left L3 superior hemilaminotomy, left L2-3 medial facetectomy, left L3 foraminotomy, with microdissection, microsurgical technique, and the operating microscope  SURGEON: Jovita Gamma, MD  ASSISTANTS: Emelda Brothers, MD  ANESTHESIA:   general  EBL:  Total I/O In: 1200 [I.V.:1200] Out: 300 [Blood:300]  BLOOD ADMINISTERED:none  COUNT:  Correct per nursing staff  DICTATION: Patient was brought to the operating room and placed under general endotracheal anesthesia. Patient was turned to prone position on the [place] table.  The lumbar region was prepped with Betadine soap and solution and draped in a sterile fashion. The midline was infiltrated with local anesthetic with epinephrine. A localizing x-ray was taken and the L2-3 level was identified. Midline incision was made over the L2-3 level and was carried down through the subcutaneous tissue to the lumbar fascia. The lumbar fascia was incised on the left side and the paraspinal muscles were dissected from the spinous processes and lamina in a subperiosteal fashion. Another x-ray was taken and the L2-3 intralaminar space was identified. The operating microscope was draped and brought into the field provided additional magnification, illumination, and visualization. Laminotomy was performed using the high-speed drill and Kerrison punches. The ligamentum flavum was carefully resected. The underlying thecal sac and exiting left L3 nerve root were identified.  A left L2-3 medial  facetectomy was performed along with a foraminotomies for the exiting left L3 nerve root.  The ventral epidural space was explored.  The spondylitic annulus of the L2-3 disc was identified.  We explored the ventral epidural space between the thecal sac and exiting left L3 nerve root and the dorsal aspect of the L3 vertebra.  Epidural veins were coagulated and divided.  No disc herniation was found.  The epidural space was thoroughly explored, and after clearing away epidural soft tissues, no compression of the thecal sac or nerve root was found.  Spinal canal and left L3 neuroforamen were widely patent.  We then establish hemostasis with bipolar cautery and Gelfoam with thrombin.  The Gelfoam was removed, and a thin layer Surgifoam applied.  Hemostasis was confirmed.  We then instilled 2 cc of fentanyl and 80 mg of Depo-Medrol into the epidural space. Deep fascia was closed with interrupted undyed 1 Vicryl sutures. Scarpa's fascia was closed with interrupted undyed 1 Vicryl sutures in the subcutaneous and subcuticular layer were closed with interrupted inverted 2-0 undyed Vicryl sutures. The skin edges were approximated with Dermabond.  A dressing of sterile gauze and Hypafix was applied.  Following surgery the patient was turned back to a supine position to be reversed from the anesthetic extubated and transferred to the recovery room for further care.   PLAN OF CARE: Admit for overnight observation  PATIENT DISPOSITION:  PACU - hemodynamically stable.   Delay start of Pharmacological VTE agent (>24hrs) due to surgical blood loss or risk of bleeding:  yes

## 2018-05-04 NOTE — Anesthesia Procedure Notes (Signed)
Procedure Name: Intubation Date/Time: 05/04/2018 7:33 AM Performed by: Renato Shin, CRNA Pre-anesthesia Checklist: Patient identified, Emergency Drugs available, Suction available and Patient being monitored Patient Re-evaluated:Patient Re-evaluated prior to induction Oxygen Delivery Method: Circle system utilized Preoxygenation: Pre-oxygenation with 100% oxygen Induction Type: IV induction Ventilation: Mask ventilation without difficulty Laryngoscope Size: Miller and 3 Grade View: Grade I Tube type: Oral Tube size: 7.5 mm Number of attempts: 1 Airway Equipment and Method: Stylet Placement Confirmation: ETT inserted through vocal cords under direct vision,  positive ETCO2,  CO2 detector and breath sounds checked- equal and bilateral Secured at: 21 cm Tube secured with: Tape Dental Injury: Teeth and Oropharynx as per pre-operative assessment

## 2018-05-04 NOTE — H&P (Signed)
Subjective: Patient is a 47 y.o. white male who is admitted for treatment of acute left lumbar radiculopathy secondary to a moderately large L2-3 lumbar disc herniation that is migrated caudally behind the body of L3 on the left side.  He does have his review of chronic low back and bilateral lumbar radicular pain due to multilevel degenerative changes to the lumbar spine.  He also has a history of undergoing a L5 Gill procedure and L5-S1 PLIF and PLA with radius posterior instrumentation in July 2010.  He is admitted now for a left L2-3 lumbar laminotomy and microdiscectomy.  Patient explains that 3 months ago he developed increased pain into the left buttock, thigh, and leg.  It extended to his left great toe.  He was evaluated by Dr. Bo Merino with an updated MRI that revealed a new left sided disc herniation behind the body of L3, that appears to have risen from the L2-3 disc level and extending caudally.   Patient Active Problem List   Diagnosis Date Noted  . Primary osteoarthritis of both hands 03/23/2018  . Primary osteoarthritis of both feet 03/23/2018  . Chronic SI joint pain 03/23/2018  . DDD (degenerative disc disease), lumbar 03/23/2018  . Migraine 02/15/2017  . Nocturia 02/15/2017  . Psoriasis 11/09/2016  . Depression 04/25/2015  . History of lumbar fusion 02/07/2014  . Chronic pain syndrome 01/04/2014  . OSA (obstructive sleep apnea) 06/16/2013  . Former tobacco use 06/12/2013  . Chronic fatigue 06/12/2013  . HTN (hypertension) 07/15/2012  . Morbid obesity ([place]) 11/20/2009  . MAJOR DPRSV DISORDER RECURRENT EPISODE MODERATE 03/28/2009   Past Medical History:  Diagnosis Date  . Anxiety   . Chronic fatigue   . Chronic pain   . Dyslipidemia   . History of kidney stones   . HTN (hypertension)   . Lumbar disc disease with radiculopathy    sees Dr. Rita Ohara  . Major depressive disorder, recurrent episode, moderate ([place])   . Migraine headache   . Morbid obesity ([place])    . Sleep apnea    does not use cpap  . Tachycardia    a. 07/2012 - placed on BB. CT angio neg.  Marland Kitchen Urethral stricture    Was told he would need pediatric cath if ever had urinary cath    Past Surgical History:  Procedure Laterality Date  . CARDIAC CATHETERIZATION  05/2013   minimal coronary plaque with normal LV fxn (hochrein)  . fatty tumor removal    . LEFT HEART CATHETERIZATION WITH CORONARY ANGIOGRAM N/A 06/13/2013   Procedure: LEFT HEART CATHETERIZATION WITH CORONARY ANGIOGRAM;  Surgeon: Minus Breeding, MD;  Location[facility] CATH LAB;  Service: Cardiovascular;  Laterality: N/A;  . LUMBAR FUSION  09/2008   Dr. Sherwood Gambler    Medications Prior to Admission  Medication Sig Dispense Refill Last Dose  . gabapentin (NEURONTIN) 300 MG capsule Take 300 mg by mouth See admin instructions. Take 1 capsule (300 mg) by mouth daily in the morning & take 2 capsules (600 mg) by mouth in the evening.   05/04/2018 at 0415  . lisinopril (PRINIVIL,ZESTRIL) 20 MG tablet Take 1 tablet (20 mg total) by mouth daily. 90 tablet 1 05/03/2018 at Unknown time  . metoprolol succinate (TOPROL-XL) 25 MG 24 hr tablet TAKE ONE-HALF TABLET BY  MOUTH DAILY 45 tablet 0 05/04/2018 at 0415  . morphine (MS CONTIN) 30 MG 12 hr tablet Take 30 mg by mouth daily.    05/04/2018 at 0415  . Multiple Vitamin (MULTIVITAMIN WITH  MINERALS) TABS tablet Take 1 tablet by mouth daily.   Past Week at Unknown time  . naproxen sodium (ANAPROX) 220 MG tablet Take 440 mg by mouth daily.    Past Week at Unknown time  . PARoxetine (PAXIL) 20 MG tablet Take 1 tablet (20 mg total) by mouth daily. 90 tablet 1 05/04/2018 at 0415  . Pseudoephedrine-APAP-DM (DAYQUIL PO) Take 2 capsules by mouth at bedtime as needed (for colds/severe sinus issues.).    Past Month at Unknown time  . SUMAtriptan (IMITREX) 50 MG tablet TAKE 1 TABLET BY MOUTH EVERY 2 HOURS AS NEEDED FOR MIGRAINE. MAY REPEAT IN 2 HOURS IF HEADACHE PERSISTS OR RECURS (Patient taking differently: Take  50 mg by mouth every 2 (two) hours as needed for migraine. ) 10 tablet 5 Past Month at Unknown time  . tiZANidine (ZANAFLEX) 4 MG tablet Take 4 mg by mouth 2 (two) times daily.   0 05/04/2018 at 0415   No Known Allergies  Social History   Tobacco Use  . Smoking status: Former Smoker    Years: 30.00    Last attempt to quit: 01/24/2009    Years since quitting: 9.2  . Smokeless tobacco: Never Used  . Tobacco comment: 1/2 ppd-2ppd - quit 2010.  Substance Use Topics  . Alcohol use: Yes    Alcohol/week: 0.0 standard drinks    Comment: Rarel beer. 1-2/yr    Family History  Problem Relation Age of Onset  . Hypertension Mother   . Cancer Mother   . Lumbar disc disease Mother   . Cancer Paternal Grandfather        lung  . CAD Maternal Grandmother        age 6 - CABG.  . Fibromyalgia Unknown        Sister and daughter  . Allergy (severe) Unknown      Review of Systems Pertinent items noted in HPI and remainder of comprehensive ROS otherwise negative.  Objective: Vital signs in last 24 hours: Temp:  [98.7 F (37.1 C)] 98.7 F (37.1 C) (10/23 0547) Pulse Rate:  [84] 84 (10/23 0547) Resp:  [20] 20 (10/23 0547) BP: (102)/(68) 102/68 (10/23 0547) SpO2:  [96 %] 96 % (10/23 0547) Weight:  [161.5 kg] 161.5 kg (10/23 0547)  EXAM: Morbidly obese white male in no acute distress.   Lungs are clear to auscultation , the patient has symmetrical respiratory excursion. Heart has a regular rate and rhythm normal S1 and S2 no murmur.   Abdomen is soft nontender nondistended bowel sounds are present. Extremity examination shows no clubbing cyanosis or edema, however he does have evidence of psoriasis on his extremities. Motor examination shows 5 over 5 strength in the lower extremities including the iliopsoas quadriceps dorsiflexor extensor hallicus  longus and plantar flexor bilaterally. Sensation is intact to pinprick in the distal lower extremities. Reflexes are symmetrical bilaterally. No  pathologic reflexes are present. Patient has a normal gait and stance.  Data Review:CBC    Component Value Date/Time   WBC 12.3 (H) 04/27/2018 1029   RBC 5.36 04/27/2018 1029   HGB 13.0 04/27/2018 1029   HCT 43.5 04/27/2018 1029   PLT 269 04/27/2018 1029   MCV 81.2 04/27/2018 1029   MCH 24.3 (L) 04/27/2018 1029   MCHC 29.9 (L) 04/27/2018 1029   RDW 14.7 04/27/2018 1029   LYMPHSABS 3.0 04/25/2015 1255   MONOABS 0.6 04/25/2015 1255   EOSABS 0.1 04/25/2015 1255   BASOSABS 0.0 04/25/2015 1255  BMET    Component Value Date/Time   NA 136 04/27/2018 1029   K 4.4 04/27/2018 1029   CL 103 04/27/2018 1029   CO2 26 04/27/2018 1029   GLUCOSE 99 04/27/2018 1029   BUN 14 04/27/2018 1029   CREATININE 0.85 04/27/2018 1029   CREATININE 0.74 02/15/2017 1020   CALCIUM 8.9 04/27/2018 1029   GFRNONAA >60 04/27/2018 1029   GFRAA >60 04/27/2018 1029     Assessment/Plan: Patient with long-standing chronic back pain extending into the lower extremities, but with new left lumbar radiculopathy who is been found to have a moderately large left L2-3 lumbar disc patient with a free fragment is extending caudally behind the body of L3 causing thecal sac and nerve root compression.  He is admitted now for a left L2-3 lumbar laminotomy and microdiscectomy.  I've discussed with the patient the nature of his condition, the nature the surgical procedure, the typical length of surgery, hospital stay, and overall recuperation. We discussed limitations postoperatively. I discussed risks of surgery including risks of infection, bleeding, possibly need for transfusion, the risk of nerve root dysfunction with pain, weakness, numbness, or paresthesias, or risk of dural tear and CSF leakage and possible need for further surgery, the risk of recurrent disc herniation and the possible need for further surgery, and the risk of anesthetic complications including myocardial infarction, stroke,  pneumonia, and death. Understanding all this the patient does wish to proceed with surgery and is admitted for such.  He understands that many of these risks are increased due to his morbid obesity.   Hosie Spangle, MD 05/04/2018 7:06 AM

## 2018-05-04 NOTE — Progress Notes (Signed)
Vitals:   05/04/18 1200 05/04/18 1208 05/04/18 1233 05/04/18 1610  BP: (!) 104/56 125/85 114/80 (!) 97/58  Pulse: 96  97 76  Resp: 16  16 18   Temp:   98.2 F (36.8 C) 98.6 F (37 C)  TempSrc:   Oral Oral  SpO2: 93%  90% 95%  Weight:      Height:        Patient resting in bed, comfortable.  Has ambulated in the halls.  Has voided.  Dressing clean and dry.  Discussed intraoperative findings with patient.  Plan: Encouraged to ambulate at least twice more this evening.  Continue to progress through postoperative recovery.  Hosie Spangle, MD 05/04/2018, 6:34 PM

## 2018-05-05 ENCOUNTER — Encounter (HOSPITAL_COMMUNITY): Payer: Self-pay | Admitting: Neurosurgery

## 2018-05-05 DIAGNOSIS — M19071 Primary osteoarthritis, right ankle and foot: Secondary | ICD-10-CM | POA: Diagnosis not present

## 2018-05-05 DIAGNOSIS — M19042 Primary osteoarthritis, left hand: Secondary | ICD-10-CM | POA: Diagnosis not present

## 2018-05-05 DIAGNOSIS — I10 Essential (primary) hypertension: Secondary | ICD-10-CM | POA: Diagnosis not present

## 2018-05-05 DIAGNOSIS — Z87891 Personal history of nicotine dependence: Secondary | ICD-10-CM | POA: Diagnosis not present

## 2018-05-05 DIAGNOSIS — M4726 Other spondylosis with radiculopathy, lumbar region: Secondary | ICD-10-CM | POA: Diagnosis not present

## 2018-05-05 DIAGNOSIS — G4733 Obstructive sleep apnea (adult) (pediatric): Secondary | ICD-10-CM | POA: Diagnosis not present

## 2018-05-05 DIAGNOSIS — M48061 Spinal stenosis, lumbar region without neurogenic claudication: Secondary | ICD-10-CM | POA: Diagnosis not present

## 2018-05-05 DIAGNOSIS — Z8249 Family history of ischemic heart disease and other diseases of the circulatory system: Secondary | ICD-10-CM | POA: Diagnosis not present

## 2018-05-05 DIAGNOSIS — M19041 Primary osteoarthritis, right hand: Secondary | ICD-10-CM | POA: Diagnosis not present

## 2018-05-05 DIAGNOSIS — M5116 Intervertebral disc disorders with radiculopathy, lumbar region: Secondary | ICD-10-CM | POA: Diagnosis not present

## 2018-05-05 DIAGNOSIS — M19072 Primary osteoarthritis, left ankle and foot: Secondary | ICD-10-CM | POA: Diagnosis not present

## 2018-05-05 DIAGNOSIS — Z79899 Other long term (current) drug therapy: Secondary | ICD-10-CM | POA: Diagnosis not present

## 2018-05-05 MED ORDER — HYDROCODONE-ACETAMINOPHEN 5-325 MG PO TABS: 1.0000 | ORAL_TABLET | ORAL | 0 refills | Status: DC | PRN

## 2018-05-05 NOTE — Discharge Instructions (Signed)

## 2018-05-05 NOTE — Discharge Summary (Signed)
Physician Discharge Summary  Patient ID: Richard Mcguire MRN: [id number] DOB/AGE: 08-16-1970 47 y.o.  Admit date: 05/04/2018 Discharge date: 05/05/2018  Admission Diagnoses:  Left L2-3 lumbar disc herniation with free fragment migrated caudally behind the body of L3 on the left side; lumbar degenerative disease; lumbar spondylosis; lumbar stenosis; lumbar radiculopathy  Discharge Diagnoses:  Lumbar stenosis; lumbar radiculopathy; lumbar degenerative disc disease; lumbar spondylosis Active Problems:   Lumbar disc herniation   Discharged Condition: good  Hospital Course: Patient was admitted, underwent surgery.  We had anticipated a left L2-3 microdiscectomy, however in surgery we did not find a disc herniation, and therefore performed a decompression, specifically left L2 inferior and left L3 superior hemilaminotomies, and left L3 foraminotomy.  Postoperatively he has done well.  He is up and ambulating actively in the halls.  He feels that the radicular pain in the left thigh is improved.  He does have some incisional discomfort.  He has been continued on his preadmission pain management regimen and we have supplemented that with hydrocodone.  He has been given a prescription for hydrocodone that will last through the first 3 to 5 days following surgery and then he will just continue on his usual chronic pain management.  His incision is healing nicely.  There is no erythema, swelling, ecchymosis, or drainage.  He is voiding well.  He has been given instructions regarding wound care and activities following discharge.  He is scheduled for follow-up with me in the office in 3 weeks.  Discharge Exam: Blood pressure 120/77, pulse 64, temperature 97.9 F (36.6 C), temperature source Oral, resp. rate (!) 28, height 6\' 1"  (1.854 m), weight (!) 161.5 kg, SpO2 99 %.  Disposition: Discharge disposition: 01-Home or Self Care       Discharge Instructions    Discharge wound care:   Complete by:   As directed    Leave the wound open to air. Shower daily with the wound uncovered. Water and soapy water should run over the incision area. Do not wash directly on the incision for 2 weeks. Remove the glue after 2 weeks.   Driving Restrictions   Complete by:  As directed    No driving for 2 weeks. May ride in the car locally now. May begin to drive locally in 2 weeks.   Other Restrictions   Complete by:  As directed    Walk gradually increasing distances out in the fresh air at least twice a day. Walking additional 6 times inside the house, gradually increasing distances, daily. No bending, lifting, or twisting. Perform activities between shoulder and waist height (that is at counter height when standing or table height when sitting).     Allergies as of 05/05/2018   No Known Allergies     Medication List    TAKE these medications   DAYQUIL PO Take 2 capsules by mouth at bedtime as needed (for colds/severe sinus issues.).   gabapentin 300 MG capsule Commonly known as:  NEURONTIN Take 300 mg by mouth See admin instructions. Take 1 capsule (300 mg) by mouth daily in the morning & take 2 capsules (600 mg) by mouth in the evening.   HYDROcodone-acetaminophen 5-325 MG tablet Commonly known as:  NORCO/VICODIN Take 1-2 tablets by mouth every 4 (four) hours as needed (incisional pain).   lisinopril 20 MG tablet Commonly known as:  PRINIVIL,ZESTRIL Take 1 tablet (20 mg total) by mouth daily.   metoprolol succinate 25 MG 24 hr tablet Commonly known as:  TOPROL-XL TAKE  ONE-HALF TABLET BY  MOUTH DAILY   morphine 30 MG 12 hr tablet Commonly known as:  MS CONTIN Take 30 mg by mouth daily.   multivitamin with minerals Tabs tablet Take 1 tablet by mouth daily.   naproxen sodium 220 MG tablet Commonly known as:  ALEVE Take 440 mg by mouth daily.   PARoxetine 20 MG tablet Commonly known as:  PAXIL Take 1 tablet (20 mg total) by mouth daily.   SUMAtriptan 50 MG tablet Commonly  known as:  IMITREX TAKE 1 TABLET BY MOUTH EVERY 2 HOURS AS NEEDED FOR MIGRAINE. MAY REPEAT IN 2 HOURS IF HEADACHE PERSISTS OR RECURS What changed:  See the new instructions.   tiZANidine 4 MG tablet Commonly known as:  ZANAFLEX Take 4 mg by mouth 2 (two) times daily.            Discharge Care Instructions  (From admission, onward)         Start     Ordered   05/05/18 0000  Discharge wound care:    Comments:  Leave the wound open to air. Shower daily with the wound uncovered. Water and soapy water should run over the incision area. Do not wash directly on the incision for 2 weeks. Remove the glue after 2 weeks.   05/05/18 1757           Signed: Hosie Spangle 05/05/2018, 5:57 PM

## 2018-05-05 NOTE — Progress Notes (Signed)
Pt doing well. Pt given D/C instructions with Rx, verbal understanding was provided. Pt's incision is clean and dry with no sign of infection. Pt's IV was removed prior to D/C. Pt D/C'd home via wheelchair @ 1830 per MD order. Pt is stable @ D/C and has no other needs at this time. Holli Humbles, RN

## 2018-05-24 DIAGNOSIS — Z79891 Long term (current) use of opiate analgesic: Secondary | ICD-10-CM | POA: Diagnosis not present

## 2018-05-24 DIAGNOSIS — Z79899 Other long term (current) drug therapy: Secondary | ICD-10-CM | POA: Diagnosis not present

## 2018-05-24 DIAGNOSIS — G894 Chronic pain syndrome: Secondary | ICD-10-CM | POA: Diagnosis not present

## 2018-05-24 DIAGNOSIS — M47816 Spondylosis without myelopathy or radiculopathy, lumbar region: Secondary | ICD-10-CM | POA: Diagnosis not present

## 2018-06-22 DIAGNOSIS — M545 Low back pain: Secondary | ICD-10-CM | POA: Diagnosis not present

## 2018-06-22 DIAGNOSIS — M47816 Spondylosis without myelopathy or radiculopathy, lumbar region: Secondary | ICD-10-CM | POA: Diagnosis not present

## 2018-06-22 DIAGNOSIS — G894 Chronic pain syndrome: Secondary | ICD-10-CM | POA: Diagnosis not present

## 2018-07-20 ENCOUNTER — Other Ambulatory Visit: Payer: Self-pay | Admitting: Family Medicine

## 2018-07-20 DIAGNOSIS — M47816 Spondylosis without myelopathy or radiculopathy, lumbar region: Secondary | ICD-10-CM | POA: Diagnosis not present

## 2018-07-20 DIAGNOSIS — G894 Chronic pain syndrome: Secondary | ICD-10-CM | POA: Diagnosis not present

## 2018-07-20 DIAGNOSIS — M545 Low back pain: Secondary | ICD-10-CM | POA: Diagnosis not present

## 2018-07-21 ENCOUNTER — Ambulatory Visit: Payer: Medicare Other | Admitting: Family Medicine

## 2018-08-01 ENCOUNTER — Ambulatory Visit (INDEPENDENT_AMBULATORY_CARE_PROVIDER_SITE_OTHER): Payer: Medicare Other | Admitting: Family Medicine

## 2018-08-01 ENCOUNTER — Encounter: Payer: Self-pay | Admitting: Family Medicine

## 2018-08-01 VITALS — BP 106/78 | HR 116 | Temp 99.4°F | Ht 73.0 in | Wt 361.0 lb

## 2018-08-01 DIAGNOSIS — F321 Major depressive disorder, single episode, moderate: Secondary | ICD-10-CM | POA: Diagnosis not present

## 2018-08-01 DIAGNOSIS — G894 Chronic pain syndrome: Secondary | ICD-10-CM | POA: Diagnosis not present

## 2018-08-01 DIAGNOSIS — Z23 Encounter for immunization: Secondary | ICD-10-CM | POA: Diagnosis not present

## 2018-08-01 DIAGNOSIS — F331 Major depressive disorder, recurrent, moderate: Secondary | ICD-10-CM | POA: Diagnosis not present

## 2018-08-01 MED ORDER — DULOXETINE HCL 30 MG PO CPEP: 30.0000 mg | ORAL_CAPSULE | Freq: Every day | ORAL | 3 refills | Status: DC

## 2018-08-01 MED ORDER — SUMATRIPTAN SUCCINATE 50 MG PO TABS: 50.0000 mg | ORAL_TABLET | ORAL | 3 refills | Status: DC | PRN

## 2018-08-01 NOTE — Assessment & Plan Note (Signed)
Deteriorated. >25 minutes spent in face to face time with patient, >50% spent in counselling or coordination of care discussing depression and chronic pain- will wean off paxil and start cymbalta by doing the following as discussed with pt:  Start weaning of paxil by taking 1/2 tablet (10 mg) every other daily for 2 weeks- 3 weeks. At the same time, start Cymbalta 30 mg daily. If after 2 weeks, you feel okay, you can stop the paxil all together. If after 3-4 weeks, you feel like the cymbalta is working but not as effective, we will increase it to 60 mg daily.  At that time, you should no longer be taking ANY paxil.  Follow up in 1 month.  May consider increasing cymbalta to 60 mg daily at that time if effective but not achieving desired results.

## 2018-08-01 NOTE — Patient Instructions (Signed)
Start weaning of paxil by taking 1/2 tablet (10 mg) every other daily for 2 weeks- 3 weeks. At the same time, start Cymbalta 30 mg daily. If after 2 weeks, you feel okay, you can stop the paxil all together. If after 3-4 weeks, you feel like the cymbalta is working but not as effective, we will increase it to 60 mg daily.  At that time, you should no longer be taking ANY paxil.

## 2018-08-01 NOTE — Progress Notes (Signed)
Subjective:   Patient ID: Richard Mcguire, male    DOB: 31-Jul-1970, 48 y.o.   MRN: [id number]  Richard Mcguire is a pleasant 48 y.o. year old male who presents to clinic today with Depression (Patient is here today to discuss the need for medication change.  His depression is currently being treated with Paroxetine but he is requesting for a trial of Cymbalta.  He states that the pain doctor suggested that.  Wondering if there is a need for cross titration to initiate.  He is also requesting a refill of Imitrex.)  on 08/01/2018  HPI:  Depression-  Currently taking Paxil 20 mg daily for depression.  His pain doctor asked for a trial of cymbalta and wants to know what I think about this.  Current Outpatient Medications on File Prior to Visit  Medication Sig Dispense Refill  . gabapentin (NEURONTIN) 300 MG capsule Take 300 mg by mouth See admin instructions. Take 1 capsule (300 mg) by mouth daily in the morning & take 2 capsules (600 mg) by mouth in the evening.    Marland Kitchen lisinopril (PRINIVIL,ZESTRIL) 20 MG tablet TAKE 1 TABLET BY MOUTH  DAILY 90 tablet 1  . metoprolol succinate (TOPROL-XL) 25 MG 24 hr tablet TAKE ONE-HALF TABLET BY  MOUTH DAILY 45 tablet 0  . morphine (MS CONTIN) 30 MG 12 hr tablet Take 30 mg by mouth every 12 (twelve) hours.     . Multiple Vitamin (MULTIVITAMIN WITH MINERALS) TABS tablet Take 1 tablet by mouth daily.    . naproxen sodium (ANAPROX) 220 MG tablet Take 440 mg by mouth daily.     Marland Kitchen PARoxetine (PAXIL) 20 MG tablet TAKE 1 TABLET BY MOUTH  DAILY 90 tablet 1  . Pseudoephedrine-APAP-DM (DAYQUIL PO) Take 2 capsules by mouth at bedtime as needed (for colds/severe sinus issues.).     Marland Kitchen SUMAtriptan (IMITREX) 50 MG tablet TAKE 1 TABLET BY MOUTH EVERY 2 HOURS AS NEEDED FOR MIGRAINE. MAY REPEAT IN 2 HOURS IF HEADACHE PERSISTS OR RECURS (Patient taking differently: Take 50 mg by mouth every 2 (two) hours as needed for migraine. ) 10 tablet 5  . tiZANidine (ZANAFLEX) 4 MG  tablet Take 4 mg by mouth 2 (two) times daily.   0   No current facility-administered medications on file prior to visit.     No Known Allergies  Past Medical History:  Diagnosis Date  . Anxiety   . Chronic fatigue   . Chronic pain   . Dyslipidemia   . History of kidney stones   . HTN (hypertension)   . Lumbar disc disease with radiculopathy    sees Dr. Rita Ohara  . Major depressive disorder, recurrent episode, moderate ([place])   . Migraine headache   . Morbid obesity ([place])   . Sleep apnea    does not use cpap  . Tachycardia    a. 07/2012 - placed on BB. CT angio neg.  Marland Kitchen Urethral stricture    Was told he would need pediatric cath if ever had urinary cath    Past Surgical History:  Procedure Laterality Date  . CARDIAC CATHETERIZATION  05/2013   minimal coronary plaque with normal LV fxn (hochrein)  . fatty tumor removal    . LEFT HEART CATHETERIZATION WITH CORONARY ANGIOGRAM N/A 06/13/2013   Procedure: LEFT HEART CATHETERIZATION WITH CORONARY ANGIOGRAM;  Surgeon: Minus Breeding, MD;  Location: [facility] CATH LAB;  Service: Cardiovascular;  Laterality: N/A;  . LUMBAR FUSION  09/2008   Dr. Sherwood Gambler  .  LUMBAR LAMINECTOMY/DECOMPRESSION MICRODISCECTOMY Left 05/04/2018   Procedure: LEFT LUMBAR TWO- LUMBAR THREE  LAMINOTOMY, LEFT LUMBAR TWO-THREE MEDIAL FACETECTOMY AND FORAMINOTOMY;  Surgeon: Jovita Gamma, MD;  Location: [facility];  Service: Neurosurgery;  Laterality: Left;    Family History  Problem Relation Age of Onset  . Hypertension Mother   . Cancer Mother   . Lumbar disc disease Mother   . Cancer Paternal Grandfather        lung  . CAD Maternal Grandmother        age 35 - CABG.  . Fibromyalgia Unknown        Sister and daughter  . Allergy (severe) Unknown     Social History   Socioeconomic History  . Marital status: Married    Spouse name: Not on file  . Number of children: 1  . Years of education: Not on file  . Highest education level: Not on file  Occupational  History  . 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 Financial resource strain: Not on file  . Food insecurity:    Worry: Not on file    Inability: Not on file  . Transportation needs:    Medical: Not on file    Non-medical: Not on file  Tobacco Use  . Smoking status: Former Smoker    Years: 30.00    Last attempt to quit: 01/24/2009    Years since quitting: 9.5  . Smokeless tobacco: Never Used  . Tobacco comment: 1/2 ppd-2ppd - quit 2010.  Substance and Sexual Activity  . Alcohol use: Yes    Alcohol/week: 0.0 standard drinks    Comment: Rarel beer. 1-2/yr  . Drug use: No  . Sexual activity: Not on file  Lifestyle  . Physical activity:    Days per week: Not on file    Minutes per session: Not on file  . Stress: Not on file  Relationships  . Social connections:    Talks on phone: Not on file    Gets together: Not on file    Attends religious service: Not on file    Active member of club or organization: Not on file    Attends meetings of clubs or organizations: Not on file    Relationship status: Not on file  . Intimate partner violence:    Fear of current or ex partner: Not on file    Emotionally abused: Not on file    Physically abused: Not on file    Forced sexual activity: Not on file  Other Topics Concern  . Not on file  Social History Narrative   Lives with wife and daughter.  Daughter has multiple problems, including chronic fatigue syndrome.   Does not have living will.  Would desire CPR and life support if not futile or prolonged.   The PMH, PSH, Social History, Family History, Medications, and allergies have been reviewed in [facility], and have been updated if relevant.  Depression screen [facility] 2/9 08/01/2018 01/17/2018 12/15/2017 06/17/2017 02/15/2017  Decreased Interest 3 2 3  0 2  Down, Depressed, Hopeless 3 3 3  0 2  PHQ - 2 Score 6 5 6  0 4  Altered sleeping 2 2 3  0 2  Tired, decreased energy 3 2 3  0 2  Change in appetite 1 2 3  0 -    Feeling bad or failure about yourself  3 3 3  0 2  Trouble concentrating 3 0 3 0 0  Moving slowly or fidgety/restless 0 0  3 0 0  Suicidal thoughts 1 1 0 0 1  PHQ-9 Score 19 15 24  0 11  Difficult doing work/chores Very difficult Very difficult Somewhat difficult - -   GAD 7 : Generalized Anxiety Score 08/01/2018 01/17/2018  Nervous, Anxious, on Edge 3 1  Control/stop worrying 2 3  Worry too much - different things 2 3  Trouble relaxing 2 2  Restless 1 1  Easily annoyed or irritable 2 1  Afraid - awful might happen 2 2  Total GAD 7 Score 14 13  Anxiety Difficulty Very difficult Very difficult    Current Outpatient Medications on File Prior to Visit  Medication Sig Dispense Refill  . gabapentin (NEURONTIN) 300 MG capsule Take 300 mg by mouth See admin instructions. Take 1 capsule (300 mg) by mouth daily in the morning & take 2 capsules (600 mg) by mouth in the evening.    Marland Kitchen lisinopril (PRINIVIL,ZESTRIL) 20 MG tablet TAKE 1 TABLET BY MOUTH  DAILY 90 tablet 1  . metoprolol succinate (TOPROL-XL) 25 MG 24 hr tablet TAKE ONE-HALF TABLET BY  MOUTH DAILY 45 tablet 0  . morphine (MS CONTIN) 30 MG 12 hr tablet Take 30 mg by mouth every 12 (twelve) hours.     . Multiple Vitamin (MULTIVITAMIN WITH MINERALS) TABS tablet Take 1 tablet by mouth daily.    . naproxen sodium (ANAPROX) 220 MG tablet Take 440 mg by mouth daily.     Marland Kitchen PARoxetine (PAXIL) 20 MG tablet TAKE 1 TABLET BY MOUTH  DAILY 90 tablet 1  . Pseudoephedrine-APAP-DM (DAYQUIL PO) Take 2 capsules by mouth at bedtime as needed (for colds/severe sinus issues.).     Marland Kitchen SUMAtriptan (IMITREX) 50 MG tablet TAKE 1 TABLET BY MOUTH EVERY 2 HOURS AS NEEDED FOR MIGRAINE. MAY REPEAT IN 2 HOURS IF HEADACHE PERSISTS OR RECURS (Patient taking differently: Take 50 mg by mouth every 2 (two) hours as needed for migraine. ) 10 tablet 5  . tiZANidine (ZANAFLEX) 4 MG tablet Take 4 mg by mouth 2 (two) times daily.   0   No current facility-administered medications  on file prior to visit.     No Known Allergies  Past Medical History:  Diagnosis Date  . Anxiety   . Chronic fatigue   . Chronic pain   . Dyslipidemia   . History of kidney stones   . HTN (hypertension)   . Lumbar disc disease with radiculopathy    sees Dr. Rita Ohara  . Major depressive disorder, recurrent episode, moderate ([place])   . Migraine headache   . Morbid obesity ([place])   . Sleep apnea    does not use cpap  . Tachycardia    a. 07/2012 - placed on BB. CT angio neg.  Marland Kitchen Urethral stricture    Was told he would need pediatric cath if ever had urinary cath    Past Surgical History:  Procedure Laterality Date  . CARDIAC CATHETERIZATION  05/2013   minimal coronary plaque with normal LV fxn (hochrein)  . fatty tumor removal    . LEFT HEART CATHETERIZATION WITH CORONARY ANGIOGRAM N/A 06/13/2013   Procedure: LEFT HEART CATHETERIZATION WITH CORONARY ANGIOGRAM;  Surgeon: Minus Breeding, MD;  Location[facility] CATH LAB;  Service: Cardiovascular;  Laterality: N/A;  . LUMBAR FUSION  09/2008   Dr. Sherwood Gambler  . LUMBAR LAMINECTOMY/DECOMPRESSION MICRODISCECTOMY Left 05/04/2018   Procedure: LEFT LUMBAR TWO- LUMBAR THREE  LAMINOTOMY, LEFT LUMBAR TWO-THREE MEDIAL FACETECTOMY AND FORAMINOTOMY;  Surgeon: Jovita Gamma, MD;  Location: [facility];  Service: Neurosurgery;  Laterality: Left;    Family History  Problem Relation Age of Onset  . Hypertension Mother   . Cancer Mother   . Lumbar disc disease Mother   . Cancer Paternal Grandfather        lung  . CAD Maternal Grandmother        age 37 - CABG.  . Fibromyalgia Unknown        Sister and daughter  . Allergy (severe) Unknown     Social History   Socioeconomic History  . Marital status: Married    Spouse name: Not on file  . Number of children: 1  . Years of education: Not on file  . Highest education level: Not on file  Occupational History  . 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 Financial resource  strain: Not on file  . Food insecurity:    Worry: Not on file    Inability: Not on file  . Transportation needs:    Medical: Not on file    Non-medical: Not on file  Tobacco Use  . Smoking status: Former Smoker    Years: 30.00    Last attempt to quit: 01/24/2009    Years since quitting: 9.5  . Smokeless tobacco: Never Used  . Tobacco comment: 1/2 ppd-2ppd - quit 2010.  Substance and Sexual Activity  . Alcohol use: Yes    Alcohol/week: 0.0 standard drinks    Comment: Rarel beer. 1-2/yr  . Drug use: No  . Sexual activity: Not on file  Lifestyle  . Physical activity:    Days per week: Not on file    Minutes per session: Not on file  . Stress: Not on file  Relationships  . Social connections:    Talks on phone: Not on file    Gets together: Not on file    Attends religious service: Not on file    Active member of club or organization: Not on file    Attends meetings of clubs or organizations: Not on file    Relationship status: Not on file  . Intimate partner violence:    Fear of current or ex partner: Not on file    Emotionally abused: Not on file    Physically abused: Not on file    Forced sexual activity: Not on file  Other Topics Concern  . Not on file  Social History Narrative   Lives with wife and daughter.  Daughter has multiple problems, including chronic fatigue syndrome.   Does not have living will.  Would desire CPR and life support if not futile or prolonged.   The PMH, PSH, Social History, Family History, Medications, and allergies have been reviewed in [facility], and have been updated if relevant.  Review of Systems  Constitutional: Negative.   HENT: Negative.   Cardiovascular: Negative.   Gastrointestinal: Negative.   Musculoskeletal: Positive for arthralgias, back pain and neck pain.  Skin: Negative.   Psychiatric/Behavioral: Positive for dysphoric mood and sleep disturbance. Negative for agitation, behavioral problems, confusion, decreased concentration,  hallucinations, self-injury and suicidal ideas. The patient is nervous/anxious. The patient is not hyperactive.   All other systems reviewed and are negative.      Objective:    BP 106/78 (BP Location: Left Arm, Patient Position: Sitting, Cuff Size: Large)   Pulse (!) 116   Temp 99.4 F (37.4 C) (Oral)   Ht 6\' 1"  (1.854 m)   Wt (!) 361 lb (163.7 kg)  SpO2 96%   BMI 47.63 kg/m    Physical Exam Vitals signs and nursing note reviewed.  Constitutional:      Appearance: Normal appearance. He is obese.  HENT:     Head: Normocephalic.     Mouth/Throat:     Mouth: Mucous membranes are moist.  Eyes:     Extraocular Movements: Extraocular movements intact.  Neck:     Musculoskeletal: Normal range of motion.  Cardiovascular:     Rate and Rhythm: Normal rate.     Pulses: Normal pulses.  Pulmonary:     Effort: Pulmonary effort is normal.  Musculoskeletal: Normal range of motion.  Skin:    General: Skin is warm and dry.  Neurological:     General: No focal deficit present.     Mental Status: He is alert. Mental status is at baseline.  Psychiatric:        Mood and Affect: Mood normal.        Behavior: Behavior normal.        Thought Content: Thought content normal.        Judgment: Judgment normal.           Assessment & Plan:   Need for influenza vaccination - Plan: Flu Vaccine QUAD 6+ mos PF IM (Fluarix Quad PF)  MAJOR DPRSV DISORDER RECURRENT EPISODE MODERATE  Chronic pain syndrome No follow-ups on file.

## 2018-08-05 DIAGNOSIS — M5136 Other intervertebral disc degeneration, lumbar region: Secondary | ICD-10-CM | POA: Diagnosis not present

## 2018-08-05 DIAGNOSIS — Z9889 Other specified postprocedural states: Secondary | ICD-10-CM | POA: Diagnosis not present

## 2018-08-05 DIAGNOSIS — M47816 Spondylosis without myelopathy or radiculopathy, lumbar region: Secondary | ICD-10-CM | POA: Diagnosis not present

## 2018-08-05 DIAGNOSIS — M62830 Muscle spasm of back: Secondary | ICD-10-CM | POA: Diagnosis not present

## 2018-08-17 DIAGNOSIS — Z79899 Other long term (current) drug therapy: Secondary | ICD-10-CM | POA: Diagnosis not present

## 2018-08-17 DIAGNOSIS — M545 Low back pain: Secondary | ICD-10-CM | POA: Diagnosis not present

## 2018-08-17 DIAGNOSIS — Z79891 Long term (current) use of opiate analgesic: Secondary | ICD-10-CM | POA: Diagnosis not present

## 2018-08-17 DIAGNOSIS — G894 Chronic pain syndrome: Secondary | ICD-10-CM | POA: Diagnosis not present

## 2018-08-17 DIAGNOSIS — M47816 Spondylosis without myelopathy or radiculopathy, lumbar region: Secondary | ICD-10-CM | POA: Diagnosis not present

## 2018-08-26 ENCOUNTER — Other Ambulatory Visit: Payer: Self-pay

## 2018-08-26 MED ORDER — DULOXETINE HCL 30 MG PO CPEP: 30.0000 mg | ORAL_CAPSULE | Freq: Every day | ORAL | 1 refills | Status: DC

## 2018-09-06 NOTE — Progress Notes (Signed)
Subjective:   Patient ID: Richard Mcguire, male    DOB: August 19, 1970, 48 y.o.   MRN: [id number]  Richard Mcguire is a pleasant 48 y.o. year old male who presents to clinic today with Follow-up (Patient is here today to F/U with anxiety and depression.  At last visit on 1.20.20 decision made to cross titrate from Paxil to Cymbalta 30mg  with the possibility of increasing to a therapeutic does of 60mg . He feels that an increase is needed. He has a 90d Rx for 30mg  so he can use that for the increase and then let us know when he is in need of the 60mg .)  on 09/08/2018  HPI:  Last saw patient on 08/01/18. Note reviewed.  At that [place], he was complaining of worsening depression. Was taking Paxil 20 mg daily for depression.  His pain management physician asked about a trial of cymbalta and he asked me how I felt about using cymbalta for depression and chronic pain.  We decided to a trial of weaning off paxil and starting Cymbalta 30 mg daily.  We discussed follow up here today and perhaps increasing his Cymbalta to 60 mg daily.  He feels less on edge, less anxious but still irritable.  Depression screen [facility] 2/9 09/08/2018 08/01/2018 01/17/2018 12/15/2017 06/17/2017  Decreased Interest 2 3 2 3  0  Down, Depressed, Hopeless 2 3 3 3  0  PHQ - 2 Score 4 6 5 6  0  Altered sleeping 1 2 2 3  0  Tired, decreased energy 2 3 2 3  0  Change in appetite 1 1 2 3  0  Feeling bad or failure about yourself  2 3 3 3  0  Trouble concentrating 1 3 0 3 0  Moving slowly or fidgety/restless 1 0 0 3 0  Suicidal thoughts 1 1 1  0 0  PHQ-9 Score 13 19 15 24  0  Difficult doing work/chores Somewhat difficult Very difficult Very difficult Somewhat difficult -   GAD 7 : Generalized Anxiety Score 09/08/2018 08/01/2018 01/17/2018  Nervous, Anxious, on Edge 1 3 1   Control/stop worrying 2 2 3   Worry too much - different things 2 2 3   Trouble relaxing 3 2 2   Restless 0 1 1  Easily annoyed or irritable 1 2 1   Afraid - awful might  happen 0 2 2  Total GAD 7 Score 9 14 13   Anxiety Difficulty Somewhat difficult Very difficult Very difficult    Current Outpatient Medications on File Prior to Visit  Medication Sig Dispense Refill  . gabapentin (NEURONTIN) 300 MG capsule Take 300 mg by mouth See admin instructions. Take 1 capsule (300 mg) by mouth daily in the morning & take 2 capsules (600 mg) by mouth in the evening.    Marland Kitchen lisinopril (PRINIVIL,ZESTRIL) 20 MG tablet TAKE 1 TABLET BY MOUTH  DAILY 90 tablet 1  . metoprolol succinate (TOPROL-XL) 25 MG 24 hr tablet TAKE ONE-HALF TABLET BY  MOUTH DAILY 45 tablet 0  . morphine (MS CONTIN) 30 MG 12 hr tablet Take 30 mg by mouth every 12 (twelve) hours.     . Multiple Vitamin (MULTIVITAMIN WITH MINERALS) TABS tablet Take 1 tablet by mouth daily.    . naproxen sodium (ANAPROX) 220 MG tablet Take 440 mg by mouth daily.     . Pseudoephedrine-APAP-DM (DAYQUIL PO) Take 2 capsules by mouth at bedtime as needed (for colds/severe sinus issues.).     Marland Kitchen tiZANidine (ZANAFLEX) 4 MG tablet Take 4 mg by mouth 2 (two) times  daily.   0   No current facility-administered medications on file prior to visit.     No Known Allergies  Past Medical History:  Diagnosis Date  . Anxiety   . Chronic fatigue   . Chronic pain   . Dyslipidemia   . History of kidney stones   . HTN (hypertension)   . Lumbar disc disease with radiculopathy    sees Dr. Rita Ohara  . Major depressive disorder, recurrent episode, moderate ([place])   . Migraine headache   . Morbid obesity ([place])   . Sleep apnea    does not use cpap  . Tachycardia    a. 07/2012 - placed on BB. CT angio neg.  Marland Kitchen Urethral stricture    Was told he would need pediatric cath if ever had urinary cath    Past Surgical History:  Procedure Laterality Date  . CARDIAC CATHETERIZATION  05/2013   minimal coronary plaque with normal LV fxn (hochrein)  . fatty tumor removal    . LEFT HEART CATHETERIZATION WITH CORONARY ANGIOGRAM N/A 06/13/2013    Procedure: LEFT HEART CATHETERIZATION WITH CORONARY ANGIOGRAM;  Surgeon: Minus Breeding, MD;  Location: [facility] CATH LAB;  Service: Cardiovascular;  Laterality: N/A;  . LUMBAR FUSION  09/2008   Dr. Sherwood Gambler  . LUMBAR LAMINECTOMY/DECOMPRESSION MICRODISCECTOMY Left 05/04/2018   Procedure: LEFT LUMBAR TWO- LUMBAR THREE  LAMINOTOMY, LEFT LUMBAR TWO-THREE MEDIAL FACETECTOMY AND FORAMINOTOMY;  Surgeon: Jovita Gamma, MD;  Location: [place];  Service: Neurosurgery;  Laterality: Left;    Family History  Problem Relation Age of Onset  . Hypertension Mother   . Cancer Mother   . Lumbar disc disease Mother   . Cancer Paternal Grandfather        lung  . CAD Maternal Grandmother        age 19 - CABG.  . Fibromyalgia Unknown        Sister and daughter  . Allergy (severe) Unknown     Social History   Socioeconomic History  . Marital status: Married    Spouse name: Not on file  . Number of children: 1  . Years of education: Not on file  . Highest education level: Not on file  Occupational History  . 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 Financial resource strain: Not on file  . Food insecurity:    Worry: Not on file    Inability: Not on file  . Transportation needs:    Medical: Not on file    Non-medical: Not on file  Tobacco Use  . Smoking status: Former Smoker    Years: 30.00    Last attempt to quit: 01/24/2009    Years since quitting: 9.6  . Smokeless tobacco: Never Used  . Tobacco comment: 1/2 ppd-2ppd - quit 2010.  Substance and Sexual Activity  . Alcohol use: Yes    Alcohol/week: 0.0 standard drinks    Comment: Rarel beer. 1-2/yr  . Drug use: No  . Sexual activity: Not on file  Lifestyle  . Physical activity:    Days per week: Not on file    Minutes per session: Not on file  . Stress: Not on file  Relationships  . Social connections:    Talks on phone: Not on file    Gets together: Not on file    Attends religious service: Not on file     Active member of club or organization: Not on file    Attends meetings of clubs  or organizations: Not on file    Relationship status: Not on file  . Intimate partner violence:    Fear of current or ex partner: Not on file    Emotionally abused: Not on file    Physically abused: Not on file    Forced sexual activity: Not on file  Other Topics Concern  . Not on file  Social History Narrative   Lives with wife and daughter.  Daughter has multiple problems, including chronic fatigue syndrome.   Does not have living will.  Would desire CPR and life support if not futile or prolonged.   The PMH, PSH, Social History, Family History, Medications, and allergies have been reviewed in [facility], and have been updated if relevant.  Review of Systems  Constitutional: Negative.   HENT: Negative.   Eyes: Negative.   Respiratory: Negative.   Gastrointestinal: Negative.   Endocrine: Negative.   Genitourinary: Negative.   Musculoskeletal: Positive for arthralgias, back pain and myalgias.  Skin: Negative.   Allergic/Immunologic: Negative.   Neurological: Negative.   Hematological: Negative.   Psychiatric/Behavioral: Positive for sleep disturbance. Negative for agitation, behavioral problems, confusion, decreased concentration, dysphoric mood, hallucinations, self-injury and suicidal ideas. The patient is nervous/anxious. The patient is not hyperactive.   All other systems reviewed and are negative.      Objective:    BP 126/80 (BP Location: Left Arm, Patient Position: Sitting, Cuff Size: Large)   Pulse (!) 111   Temp 99.7 F (37.6 C) (Oral)   Ht 6\' 1"  (1.854 m)   Wt (!) 361 lb (163.7 kg)   SpO2 95%   BMI 47.63 kg/m    Physical Exam Vitals signs and nursing note reviewed.  Constitutional:      General: He is not in acute distress.    Appearance: Normal appearance. He is obese. He is not ill-appearing.  HENT:     Head: Normocephalic.     Right Ear: External ear normal.     Left Ear:  External ear normal.     Mouth/Throat:     Mouth: Mucous membranes are moist.  Eyes:     Extraocular Movements: Extraocular movements intact.     Conjunctiva/sclera: Conjunctivae normal.  Neck:     Musculoskeletal: Normal range of motion.  Cardiovascular:     Rate and Rhythm: Normal rate.  Pulmonary:     Effort: Pulmonary effort is normal.  Musculoskeletal: Normal range of motion.     Right lower leg: No edema.     Left lower leg: No edema.  Skin:    General: Skin is warm and dry.  Neurological:     General: No focal deficit present.     Mental Status: He is alert and oriented to person, place, and time.  Psychiatric:        Mood and Affect: Mood normal.        Behavior: Behavior normal.        Thought Content: Thought content normal.        Judgment: Judgment normal.           Assessment & Plan:   MAJOR DPRSV DISORDER RECURRENT EPISODE MODERATE  Chronic pain syndrome No follow-ups on file.

## 2018-09-08 ENCOUNTER — Encounter: Payer: Self-pay | Admitting: Family Medicine

## 2018-09-08 ENCOUNTER — Other Ambulatory Visit: Payer: Self-pay | Admitting: Family Medicine

## 2018-09-08 ENCOUNTER — Ambulatory Visit (INDEPENDENT_AMBULATORY_CARE_PROVIDER_SITE_OTHER): Payer: Medicare Other | Admitting: Family Medicine

## 2018-09-08 VITALS — BP 126/80 | HR 111 | Temp 99.7°F | Ht 73.0 in | Wt 361.0 lb

## 2018-09-08 DIAGNOSIS — R7989 Other specified abnormal findings of blood chemistry: Secondary | ICD-10-CM | POA: Diagnosis not present

## 2018-09-08 DIAGNOSIS — F331 Major depressive disorder, recurrent, moderate: Secondary | ICD-10-CM | POA: Diagnosis not present

## 2018-09-08 DIAGNOSIS — G894 Chronic pain syndrome: Secondary | ICD-10-CM

## 2018-09-08 LAB — TSH: TSH: 1.47 u[IU]/mL (ref 0.35–4.50)

## 2018-09-08 LAB — COMPREHENSIVE METABOLIC PANEL
ALBUMIN: 4.1 g/dL (ref 3.5–5.2)
ALK PHOS: 128 U/L — AB (ref 39–117)
ALT: 16 U/L (ref 0–53)
AST: 15 U/L (ref 0–37)
BILIRUBIN TOTAL: 0.4 mg/dL (ref 0.2–1.2)
BUN: 16 mg/dL (ref 6–23)
CALCIUM: 9.2 mg/dL (ref 8.4–10.5)
CO2: 29 mEq/L (ref 19–32)
CREATININE: 0.88 mg/dL (ref 0.40–1.50)
Chloride: 101 mEq/L (ref 96–112)
GFR: 92.86 mL/min (ref >60.00)
Glucose, Bld: 97 mg/dL (ref 70–99)
Potassium: 4.7 mEq/L (ref 3.5–5.1)
SODIUM: 137 meq/L (ref 135–145)
Total Protein: 6.7 g/dL (ref 6.0–8.3)

## 2018-09-08 LAB — TESTOSTERONE: TESTOSTERONE: 104.79 ng/dL — AB (ref 300.00–890.00)

## 2018-09-08 MED ORDER — SUMATRIPTAN SUCCINATE 50 MG PO TABS: 50.0000 mg | ORAL_TABLET | ORAL | 99 refills | Status: DC | PRN

## 2018-09-08 MED ORDER — DULOXETINE HCL 60 MG PO CPEP: 60.0000 mg | ORAL_CAPSULE | Freq: Every day | ORAL | 3 refills | Status: DC

## 2018-09-08 NOTE — Patient Instructions (Signed)
Great to see you. We are increasing your Cymbalta 60 mg daily. Please keep me updated.

## 2018-09-08 NOTE — Addendum Note (Signed)
Addended by: Lynnea Ferrier on: 09/08/2018 07:55 AM   Modules accepted: Orders

## 2018-09-08 NOTE — Assessment & Plan Note (Signed)
>  25 minutes spent in face to face time with patient, >50% spent in counselling or coordination of care Improved but he is still feeling irritable- feels a lot of it is situational. He would like to try increasing Cymbalta to 60 mg daily. We will increase from Cymbalta 30 mg daily to Cymbalta 60 mg daily. He will update me in a few weeks.

## 2018-09-09 ENCOUNTER — Telehealth: Payer: Self-pay | Admitting: Family Medicine

## 2018-09-09 NOTE — Telephone Encounter (Signed)
Copied from [place] [id number]. Topic: General - Other >> Sep 09, 2018  5:21 PM Keene Breath wrote: Reason for CRM: Shanon Brow with [facility] called to get clarification on the dosage for the patient's medication, SUMAtriptan (IMITREX) 50 MG tablet.  Pharmacist needs some clarity on the dosage and how often the patient should be taking the medication.  Please advise and call back at [phone], Ref# [id number].

## 2018-09-11 LAB — TESTOSTERONE, FREE: TESTOSTERONE FREE: 15.1 pg/mL — AB (ref 46.0–224.0)

## 2018-09-11 LAB — SEX HORMONE BINDING GLOBULIN: Sex Hormone Binding: 35 nmol/L (ref 10–50)

## 2018-09-12 ENCOUNTER — Other Ambulatory Visit: Payer: Self-pay

## 2018-09-12 MED ORDER — SUMATRIPTAN SUCCINATE 50 MG PO TABS: ORAL_TABLET | ORAL | 99 refills | Status: DC

## 2018-09-15 NOTE — Telephone Encounter (Signed)
Called [facility], from their they no longer saw a hold but when they went to transfer me to pharmacy was on hold for 10 mins then call suddenly dropped. Will need to call back

## 2018-09-20 ENCOUNTER — Other Ambulatory Visit: Payer: Self-pay | Admitting: Family Medicine

## 2018-09-20 DIAGNOSIS — R7989 Other specified abnormal findings of blood chemistry: Secondary | ICD-10-CM

## 2018-09-21 ENCOUNTER — Telehealth: Payer: Self-pay

## 2018-09-21 ENCOUNTER — Other Ambulatory Visit: Payer: Self-pay

## 2018-09-21 DIAGNOSIS — G894 Chronic pain syndrome: Secondary | ICD-10-CM | POA: Diagnosis not present

## 2018-09-21 DIAGNOSIS — M545 Low back pain: Secondary | ICD-10-CM | POA: Diagnosis not present

## 2018-09-21 DIAGNOSIS — M47816 Spondylosis without myelopathy or radiculopathy, lumbar region: Secondary | ICD-10-CM | POA: Diagnosis not present

## 2018-09-21 MED ORDER — SUMATRIPTAN SUCCINATE 50 MG PO TABS: ORAL_TABLET | ORAL | 3 refills | Status: DC

## 2018-09-21 NOTE — Telephone Encounter (Signed)
Copied from [place] [id number]. Topic: General - Other >> Sep 21, 2018 10:27 AM Carolyn Stare wrote:  Glenard Haring with [facility] call to say they need the pt recent office notes She said Dr Deborra Medina referred pt   Fax number [phone]   09/21/2018 - Fax sent to [facility] @ [facility]. Task Completed.

## 2018-09-21 NOTE — Telephone Encounter (Signed)
Resent Rx in for #30 per 90d with 3 refills/thx dmf

## 2018-10-13 ENCOUNTER — Telehealth: Payer: Self-pay | Admitting: Family Medicine

## 2018-10-13 NOTE — Telephone Encounter (Signed)
Copied from [place] [id number]. Topic: General - Inquiry >> Oct 13, 2018  2:13 PM Richardo Priest, NT wrote: Reason for CRM: Levada Dy from [facility] called back requesting patient's most recent patient notes. States she talked with someone on the 11th of March to resolve this issue. States they never received the fax and requesting another. Fax number is [phone].

## 2018-10-14 ENCOUNTER — Other Ambulatory Visit: Payer: Self-pay | Admitting: Family Medicine

## 2018-10-14 MED ORDER — DULOXETINE HCL 60 MG PO CPEP: 60.0000 mg | ORAL_CAPSULE | Freq: Every day | ORAL | 3 refills | Status: DC

## 2018-10-18 DIAGNOSIS — M47816 Spondylosis without myelopathy or radiculopathy, lumbar region: Secondary | ICD-10-CM | POA: Diagnosis not present

## 2018-10-18 DIAGNOSIS — G894 Chronic pain syndrome: Secondary | ICD-10-CM | POA: Diagnosis not present

## 2018-10-18 DIAGNOSIS — M545 Low back pain: Secondary | ICD-10-CM | POA: Diagnosis not present

## 2018-10-25 ENCOUNTER — Other Ambulatory Visit: Payer: Self-pay | Admitting: Family Medicine

## 2018-10-25 MED ORDER — DULOXETINE HCL 60 MG PO CPEP: 60.0000 mg | ORAL_CAPSULE | Freq: Every day | ORAL | 0 refills | Status: DC

## 2018-10-25 NOTE — Telephone Encounter (Signed)
Sent in $14 till MO Cymbalta arrives/thx dmf

## 2018-10-25 NOTE — Telephone Encounter (Signed)
Copied from [place] [id number]. Topic: Quick Communication - Rx Refill/Question >> Oct 25, 2018  9:57 AM Rainey Pines A wrote: Medication: DULoxetine (CYMBALTA) 60 MG capsule [[id number]] ( Patients needs 7 to 10 days supply of medication while waiting 7 days on the mail order through [facility].)  Has the patient contacted their pharmacy? Yes (Agent: If no, request that the patient contact the pharmacy for the refill.) (Agent: If yes, when and what did the pharmacy advise?)Contact PCP  Preferred Pharmacy (with phone number or street name):  Please [id number] [place], [place] [phone] (Phone) [phone] (Fax)  [place], [place] [phone] (Phone) [phone] (Fax)    Agent: Please be advised that RX refills may take up to 3 business days. We ask that you follow-up with your pharmacy.

## 2018-10-28 ENCOUNTER — Ambulatory Visit: Payer: Self-pay | Admitting: Urology

## 2018-10-31 MED ORDER — DULOXETINE HCL 60 MG PO CPEP: 60.0000 mg | ORAL_CAPSULE | Freq: Every day | ORAL | 0 refills | Status: DC

## 2018-10-31 NOTE — Telephone Encounter (Signed)
Rx resent.

## 2018-10-31 NOTE — Telephone Encounter (Signed)
Patient states that pharmacy never received RX. Patient would like to know if it can be resent. He has already spoken with pharmacy

## 2018-10-31 NOTE — Addendum Note (Signed)
Addended by: Rodrigo Ran on: 10/31/2018 01:49 PM   Modules accepted: Orders

## 2018-11-15 ENCOUNTER — Telehealth: Payer: Self-pay | Admitting: Family Medicine

## 2018-11-15 MED ORDER — DULOXETINE HCL 60 MG PO CPEP: 60.0000 mg | ORAL_CAPSULE | Freq: Every day | ORAL | 0 refills | Status: DC

## 2018-11-15 NOTE — Telephone Encounter (Signed)
Copied from [place] [id number]. Topic: Quick Communication - Rx Refill/Question >> Nov 15, 2018  9:09 AM Ahmed Prima L wrote: Medication: DULoxetine (CYMBALTA) 60 MG capsule  Has the patient contacted their pharmacy? Yes - [facility] is not getting a reply so [facility] (Agent: If no, request that the patient contact the pharmacy for the refill.) (Agent: If yes, when and what did the pharmacy advise?)  Preferred Pharm[facility] (with phone number or street name): [id number] - [place], [place] [phone] (Phone) [phone] (Fax)    Agent: Please be advised that RX refills may take up to 3 business days. We ask that you follow-up with your pharmacy.

## 2018-11-16 ENCOUNTER — Encounter: Payer: Self-pay | Admitting: Family Medicine

## 2018-11-16 ENCOUNTER — Ambulatory Visit (INDEPENDENT_AMBULATORY_CARE_PROVIDER_SITE_OTHER): Payer: Medicare Other | Admitting: Family Medicine

## 2018-11-16 ENCOUNTER — Telehealth: Payer: Self-pay

## 2018-11-16 ENCOUNTER — Other Ambulatory Visit: Payer: Self-pay | Admitting: Family Medicine

## 2018-11-16 VITALS — Ht 73.0 in | Wt 361.0 lb

## 2018-11-16 DIAGNOSIS — F321 Major depressive disorder, single episode, moderate: Secondary | ICD-10-CM

## 2018-11-16 DIAGNOSIS — G894 Chronic pain syndrome: Secondary | ICD-10-CM

## 2018-11-16 DIAGNOSIS — R7989 Other specified abnormal findings of blood chemistry: Secondary | ICD-10-CM | POA: Diagnosis not present

## 2018-11-16 MED ORDER — TESTOSTERONE 50 MG/5GM (1%) TD GEL: 5.0000 g | TRANSDERMAL | 0 refills | Status: AC

## 2018-11-16 MED ORDER — DULOXETINE HCL 60 MG PO CPEP: 60.0000 mg | ORAL_CAPSULE | Freq: Two times a day (BID) | ORAL | 0 refills | Status: DC

## 2018-11-16 NOTE — Telephone Encounter (Signed)
LMOVM/Pt has OV today/thx dmf

## 2018-11-16 NOTE — Progress Notes (Signed)
Virtual Visit via Video   Due to the COVID-19 pandemic, this visit was completed with telemedicine (audio/video) technology to reduce patient and provider exposure as well as to preserve personal protective equipment.   I connected with Richard Mcguire by a video enabled telemedicine application and verified that I am speaking with the correct person using two identifiers. Location patient: Home Location provider: [facility], Office Persons participating in the virtual visit: Richard Mcguire, Arnette Norris, MD   I discussed the limitations of evaluation and management by telemedicine and the availability of in person appointments. The patient expressed understanding and agreed to proceed.  Care Team   Patient Care Team: Lucille Passy, MD as PCP - Kathee Polite, MD as Consulting Physician (Neurosurgery) Clance, Armando Reichert, MD as Consulting Physician (Pulmonary Disease) Minus Breeding, MD as Consulting Physician (Cardiology)  Subjective:   HPI:   Low testosterone- Referred him to urology as testosterone was 104.79 on 09/08/18.  He cannot go to that urologist ( in [place]).  He is asking for referral to urologist in [place] if he needs to see a urologist.  Lab Results  Component Value Date   TESTOSTERONE 104.79 (L) 09/08/2018    Chronic pain- pain management doctor advised that he try an increased dose of Cymbalta.  Currently taking 60 mg daily.   Review of Systems  Constitutional: Positive for malaise/fatigue.  HENT: Negative.   Eyes: Negative.   Respiratory: Negative.   Cardiovascular: Negative.   Gastrointestinal: Negative.   Genitourinary: Negative.   Musculoskeletal: Negative.   Skin: Negative.   Neurological: Negative.   Endo/Heme/Allergies: Negative.   Psychiatric/Behavioral: Negative.   All other systems reviewed and are negative.    Patient Active Problem List   Diagnosis Date Noted  . Low testosterone 11/16/2018  . Lumbar disc  herniation 05/04/2018  . Primary osteoarthritis of both hands 03/23/2018  . Primary osteoarthritis of both feet 03/23/2018  . Chronic SI joint pain 03/23/2018  . DDD (degenerative disc disease), lumbar 03/23/2018  . Migraine 02/15/2017  . Nocturia 02/15/2017  . Psoriasis 11/09/2016  . Depression 04/25/2015  . History of lumbar fusion 02/07/2014  . Chronic pain syndrome 01/04/2014  . OSA (obstructive sleep apnea) 06/16/2013  . Former tobacco use 06/12/2013  . Chronic fatigue 06/12/2013  . HTN (hypertension) 07/15/2012  . Morbid obesity ([place]) 11/20/2009  . MAJOR DPRSV DISORDER RECURRENT EPISODE MODERATE 03/28/2009    Social History   Tobacco Use  . Smoking status: Former Smoker    Years: 30.00    Last attempt to quit: 01/24/2009    Years since quitting: 9.8  . Smokeless tobacco: Never Used  . Tobacco comment: 1/2 ppd-2ppd - quit 2010.  Substance Use Topics  . Alcohol use: Yes    Alcohol/week: 0.0 standard drinks    Comment: Rarel beer. 1-2/yr    Current Outpatient Medications:  .  DULoxetine (CYMBALTA) 60 MG capsule, Take 1 capsule (60 mg total) by mouth 2 (two) times daily., Disp: 180 capsule, Rfl: 0 .  gabapentin (NEURONTIN) 300 MG capsule, Take 300 mg by mouth See admin instructions. Take 1 capsule (300 mg) by mouth daily in the morning & take 2 capsules (600 mg) by mouth in the evening., Disp: , Rfl:  .  lisinopril (PRINIVIL,ZESTRIL) 20 MG tablet, TAKE 1 TABLET BY MOUTH  DAILY, Disp: 90 tablet, Rfl: 1 .  metoprolol succinate (TOPROL-XL) 25 MG 24 hr tablet, TAKE ONE-HALF TABLET BY  MOUTH DAILY, Disp: 45 tablet, Rfl: 0 .  morphine (MS CONTIN) 30 MG 12 hr tablet, Take 30 mg by mouth every 12 (twelve) hours. , Disp: , Rfl:  .  morphine (MSIR) 15 MG tablet, Take 15 mg by mouth 2 (two) times daily as needed for severe pain., Disp: , Rfl:  .  Multiple Vitamin (MULTIVITAMIN WITH MINERALS) TABS tablet, Take 1 tablet by mouth daily., Disp: , Rfl:  .  naproxen sodium (ANAPROX) 220 MG  tablet, Take 440 mg by mouth daily. , Disp: , Rfl:  .  SUMAtriptan (IMITREX) 50 MG tablet, Take 1 tab at onset; may repeat in 2 hours if headache persists or recurs. #30 per 90d, Disp: 30 tablet, Rfl: 3 .  tiZANidine (ZANAFLEX) 4 MG tablet, Take 4 mg by mouth 2 (two) times daily. , Disp: , Rfl: 0 .  Pseudoephedrine-APAP-DM (DAYQUIL PO), Take 2 capsules by mouth at bedtime as needed (for colds/severe sinus issues.). , Disp: , Rfl:  .  testosterone (ANDROGEL) 50 MG/5GM (1%) GEL, Place 5 g onto the skin every morning for 30 days., Disp: 150 g, Rfl: 0  No Known Allergies  Objective:  Ht 6\' 1"  (1.854 m)   Wt (!) 361 lb (163.7 kg)   BMI 47.63 kg/m   VITALS: Per patient if applicable, see vitals. GENERAL: Alert, appears well and in no acute distress. HEENT: Atraumatic, conjunctiva clear, no obvious abnormalities on inspection of external nose and ears. NECK: Normal movements of the head and neck. CARDIOPULMONARY: No increased WOB. Speaking in clear sentences. I:E ratio WNL.  MS: Moves all visible extremities without noticeable abnormality. PSYCH: Pleasant and cooperative, well-groomed. Speech normal rate and rhythm. Affect is appropriate. Insight and judgement are appropriate. Attention is focused, linear, and appropriate.  NEURO: CN grossly intact. Oriented as arrived to appointment on time with no prompting. Moves both UE equally.  SKIN: No obvious lesions, wounds, erythema, or cyanosis noted on face or hands.  Depression screen [facility] 2/9 09/08/2018 08/01/2018 01/17/2018  Decreased Interest 2 3 2   Down, Depressed, Hopeless 2 3 3   PHQ - 2 Score 4 6 5   Altered sleeping 1 2 2   Tired, decreased energy 2 3 2   Change in appetite 1 1 2   Feeling bad or failure about yourself  2 3 3   Trouble concentrating 1 3 0  Moving slowly or fidgety/restless 1 0 0  Suicidal thoughts 1 1 1   PHQ-9 Score 13 19 15   Difficult doing work/chores Somewhat difficult Very difficult Very difficult    Assessment and Plan:    Berkley was seen today for follow-up.  Diagnoses and all orders for this visit:  Chronic pain syndrome  Low testosterone -     Cancel: Ambulatory referral to Urology  Current moderate episode of major depressive disorder without prior episode ([place])  Other orders -     DULoxetine (CYMBALTA) 60 MG capsule; Take 1 capsule (60 mg total) by mouth 2 (two) times daily. -     testosterone (ANDROGEL) 50 MG/5GM (1%) GEL; Place 5 g onto the skin every morning for 30 days.    Marland Kitchen COVID-19 Education: The signs and symptoms of COVID-19 were discussed with the patient and how to seek care for testing if needed. The importance of social distancing was discussed today. . Reviewed expectations re: course of current medical issues. . Discussed self-management of symptoms. . Outlined signs and symptoms indicating need for more acute intervention. . Patient verbalized understanding and all questions were answered. Marland Kitchen Health Maintenance issues including appropriate healthy diet, exercise, and smoking  avoidance were discussed with patient. . See orders for this visit as documented in the electronic medical record.  Arnette Norris, MD  25 minutes, of which >50% was spent in obtaining information about his symptoms, reviewing his previous labs, evaluations, and treatments, counseling him about his condition (please see the discussed topics above), and developing a plan to further investigate it; he had a number of questions which I addressed.

## 2018-11-16 NOTE — Telephone Encounter (Signed)
LMOVM for pt to call office to go through check-in process in preparation for appointment with Dr. Deborra Medina today/thx dmf

## 2018-11-16 NOTE — Assessment & Plan Note (Addendum)
>  25 minutes spent in face to face time with patient, >50% spent in counselling or coordination of care discussing chronic pain and low testosterone.  He would like to try topical testosterone and follow up with me in 3-6 months for labs, symptom management before referral is placed to another urologist which is reasonable. eRx sent for androgel to pharmacy on file.  Also agree that it is reasonable to try an increased dose of Cymbalta- will increase dose to 60 mg twice daily. Call or send my chart message prn if these symptoms worsen or fail to improve as anticipated. The patient indicates understanding of these issues and agrees with the plan.

## 2018-11-17 DIAGNOSIS — Z79891 Long term (current) use of opiate analgesic: Secondary | ICD-10-CM | POA: Diagnosis not present

## 2018-11-17 DIAGNOSIS — M47816 Spondylosis without myelopathy or radiculopathy, lumbar region: Secondary | ICD-10-CM | POA: Diagnosis not present

## 2018-11-17 DIAGNOSIS — M545 Low back pain: Secondary | ICD-10-CM | POA: Diagnosis not present

## 2018-11-17 DIAGNOSIS — Z79899 Other long term (current) drug therapy: Secondary | ICD-10-CM | POA: Diagnosis not present

## 2018-11-17 DIAGNOSIS — G894 Chronic pain syndrome: Secondary | ICD-10-CM | POA: Diagnosis not present

## 2018-12-15 DIAGNOSIS — M545 Low back pain: Secondary | ICD-10-CM | POA: Diagnosis not present

## 2018-12-15 DIAGNOSIS — G894 Chronic pain syndrome: Secondary | ICD-10-CM | POA: Diagnosis not present

## 2018-12-15 DIAGNOSIS — M47816 Spondylosis without myelopathy or radiculopathy, lumbar region: Secondary | ICD-10-CM | POA: Diagnosis not present

## 2018-12-17 ENCOUNTER — Other Ambulatory Visit: Payer: Self-pay | Admitting: Family Medicine

## 2018-12-28 ENCOUNTER — Other Ambulatory Visit: Payer: Self-pay | Admitting: Family Medicine

## 2018-12-28 ENCOUNTER — Encounter: Payer: Self-pay | Admitting: Family Medicine

## 2018-12-28 ENCOUNTER — Ambulatory Visit (INDEPENDENT_AMBULATORY_CARE_PROVIDER_SITE_OTHER): Payer: Medicare Other | Admitting: Family Medicine

## 2018-12-28 DIAGNOSIS — R2231 Localized swelling, mass and lump, right upper limb: Secondary | ICD-10-CM

## 2018-12-28 DIAGNOSIS — R21 Rash and other nonspecific skin eruption: Secondary | ICD-10-CM

## 2018-12-28 MED ORDER — FLUCONAZOLE 150 MG PO TABS: 150.0000 mg | ORAL_TABLET | Freq: Once | ORAL | 0 refills | Status: AC

## 2018-12-28 MED ORDER — NYSTATIN 100000 UNIT/GM EX POWD: Freq: Four times a day (QID) | CUTANEOUS | 0 refills | Status: AC

## 2018-12-28 MED ORDER — DOXYCYCLINE HYCLATE 100 MG PO TABS: 100.0000 mg | ORAL_TABLET | Freq: Two times a day (BID) | ORAL | 0 refills | Status: AC

## 2018-12-28 MED ORDER — DULOXETINE HCL 60 MG PO CPEP: ORAL_CAPSULE | ORAL | 3 refills | Status: DC

## 2018-12-28 NOTE — Progress Notes (Signed)
Virtual Visit via Video   Due to the COVID-19 pandemic, this visit was completed with telemedicine (audio/video) technology to reduce patient and provider exposure as well as to preserve personal protective equipment.   I connected with Richard Mcguire by a video enabled telemedicine application and verified that I am speaking with the correct person using two identifiers. Location patient: Home Location provider: [facility], Office Persons participating in the virtual visit: Richard Mcguire, Arnette Norris, MD   I discussed the limitations of evaluation and management by telemedicine and the availability of in person appointments. The patient expressed understanding and agreed to proceed.  Care Team   Patient Care Team: Lucille Passy, MD as PCP - Kathee Polite, MD as Consulting Physician (Neurosurgery) Clance, Armando Reichert, MD as Consulting Physician (Pulmonary Disease) Minus Breeding, MD as Consulting Physician (Cardiology)  Subjective:   HPI:   Axillary mass- under right axilla, feels a lump that he noted about a month ago.  It's the size of a large pea.  Has not increased or decreased in size.  He can move it around a bit.  It is never red but it is painful sometimes.  Rash- left axilla, intermittent for 1-2 months.  Red and has a rotted meat smell to it.  Can shower and smell is back that afternoon. Does not have same rash under right arm pit.    Review of Systems  Constitutional: Negative for fever.  HENT: Negative.   Eyes: Negative.   Respiratory: Negative.   Cardiovascular: Negative.   Gastrointestinal: Negative.   Musculoskeletal: Negative.   Skin: Positive for rash.  Neurological: Negative.   Endo/Heme/Allergies: Negative.   Psychiatric/Behavioral: Negative.   All other systems reviewed and are negative.    Patient Active Problem List   Diagnosis Date Noted  . Axillary mass, right 12/28/2018  . Rash and nonspecific skin eruption 12/28/2018  .  Low testosterone 11/16/2018  . Lumbar disc herniation 05/04/2018  . Primary osteoarthritis of both hands 03/23/2018  . Primary osteoarthritis of both feet 03/23/2018  . Chronic SI joint pain 03/23/2018  . DDD (degenerative disc disease), lumbar 03/23/2018  . Migraine 02/15/2017  . Nocturia 02/15/2017  . Psoriasis 11/09/2016  . Depression 04/25/2015  . History of lumbar fusion 02/07/2014  . Chronic pain syndrome 01/04/2014  . OSA (obstructive sleep apnea) 06/16/2013  . Former tobacco use 06/12/2013  . Chronic fatigue 06/12/2013  . HTN (hypertension) 07/15/2012  . Morbid obesity ([place]) 11/20/2009  . MAJOR DPRSV DISORDER RECURRENT EPISODE MODERATE 03/28/2009    Social History   Tobacco Use  . Smoking status: Former Smoker    Years: 30.00    Quit date: 01/24/2009    Years since quitting: 9.9  . Smokeless tobacco: Never Used  . Tobacco comment: 1/2 ppd-2ppd - quit 2010.  Substance Use Topics  . Alcohol use: Yes    Alcohol/week: 0.0 standard drinks    Comment: Rarel beer. 1-2/yr    Current Outpatient Medications:  .  DULoxetine (CYMBALTA) 60 MG capsule, Take 2qd, Disp: 60 capsule, Rfl: 3 .  gabapentin (NEURONTIN) 300 MG capsule, Take 300 mg by mouth See admin instructions. Take 1 capsule (300 mg) by mouth daily in the morning & take 2 capsules (600 mg) by mouth in the evening., Disp: , Rfl:  .  lisinopril (PRINIVIL,ZESTRIL) 20 MG tablet, TAKE 1 TABLET BY MOUTH  DAILY, Disp: 90 tablet, Rfl: 1 .  metoprolol succinate (TOPROL-XL) 25 MG 24 hr tablet, TAKE ONE-HALF TABLET  BY  MOUTH DAILY, Disp: 45 tablet, Rfl: 1 .  morphine (MS CONTIN) 30 MG 12 hr tablet, Take 30 mg by mouth every 12 (twelve) hours. , Disp: , Rfl:  .  morphine (MSIR) 15 MG tablet, Take 15 mg by mouth 2 (two) times daily as needed for severe pain., Disp: , Rfl:  .  Multiple Vitamin (MULTIVITAMIN WITH MINERALS) TABS tablet, Take 1 tablet by mouth daily., Disp: , Rfl:  .  naproxen sodium (ANAPROX) 220 MG tablet, Take 440 mg  by mouth daily. , Disp: , Rfl:  .  Pseudoephedrine-APAP-DM (DAYQUIL PO), Take 2 capsules by mouth at bedtime as needed (for colds/severe sinus issues.). , Disp: , Rfl:  .  SUMAtriptan (IMITREX) 50 MG tablet, Take 1 tab at onset; may repeat in 2 hours if headache persists or recurs. #30 per 90d, Disp: 30 tablet, Rfl: 3 .  testosterone (ANDROGEL) 50 MG/5GM (1%) GEL, Place 5 g onto the skin every morning for 30 days., Disp: 150 g, Rfl: 0 .  tiZANidine (ZANAFLEX) 4 MG tablet, Take 4 mg by mouth 2 (two) times daily. , Disp: , Rfl: 0  No Known Allergies  Objective:  BP 127/86   Temp 98.8 F (37.1 C) (Oral)   Wt (!) 360 lb (163.3 kg)   BMI 47.50 kg/m   VITALS: Per patient if applicable, see vitals. GENERAL: Alert, appears well and in no acute distress. HEENT: Atraumatic, conjunctiva clear, no obvious abnormalities on inspection of external nose and ears. NECK: Normal movements of the head and neck. CARDIOPULMONARY: No increased WOB. Speaking in clear sentences. I:E ratio WNL.  MS: Moves all visible extremities without noticeable abnormality. PSYCH: Pleasant and cooperative, well-groomed. Speech normal rate and rhythm. Affect is appropriate. Insight and judgement are appropriate. Attention is focused, linear, and appropriate.  NEURO: CN grossly intact. Oriented as arrived to appointment on time with no prompting. Moves both UE equally.  SKIN:Right axilla- confluent red rash covering entire fold of axilla Left axilla- lump that I can't see put patient can feel it- and it is painful at times, no erythema visible.  Depression screen [facility] 2/9 09/08/2018 08/01/2018 01/17/2018  Decreased Interest 2 3 2   Down, Depressed, Hopeless 2 3 3   PHQ - 2 Score 4 6 5   Altered sleeping 1 2 2   Tired, decreased energy 2 3 2   Change in appetite 1 1 2   Feeling bad or failure about yourself  2 3 3   Trouble concentrating 1 3 0  Moving slowly or fidgety/restless 1 0 0  Suicidal thoughts 1 1 1   PHQ-9 Score 13 19 15    Difficult doing work/chores Somewhat difficult Very difficult Very difficult    Assessment and Plan:   Kyi was seen today for rash.  Diagnoses and all orders for this visit:  Axillary mass, right  Rash and nonspecific skin eruption  Other orders -     DULoxetine (CYMBALTA) 60 MG capsule; Take 2qd    . COVID-19 Education: The signs and symptoms of COVID-19 were discussed with the patient and how to seek care for testing if needed. The importance of social distancing was discussed today. . Reviewed expectations re: course of current medical issues. . Discussed self-management of symptoms. . Outlined signs and symptoms indicating need for more acute intervention. . Patient verbalized understanding and all questions were answered. Marland Kitchen Health Maintenance issues including appropriate healthy diet, exercise, and smoking avoidance were discussed with patient. . See orders for this visit as documented in the electronic medical  record.  Arnette Norris, MD  Records requested if needed. Time spent:25 minutes, of which >50% was spent in obtaining information about his symptoms, reviewing his previous labs, evaluations, and treatments, counseling him about his condition (please see the discussed topics above), and developing a plan to further investigate it; he had a number of questions which I addressed.

## 2018-12-28 NOTE — Assessment & Plan Note (Signed)
Consistent with candidal intertrigo- since is appears so inflamed and painful, will treat with diflucan 150 mg po x 1 along with nystatin powder. Advised to keep area dry. Call or send my chart message prn if these symptoms worsen or fail to improve as anticipated. The patient indicates understanding of these issues and agrees with the plan.

## 2018-12-28 NOTE — Patient Instructions (Signed)
Epidermal Cyst    An epidermal cyst is a small, painless lump under your skin. The cyst contains a grayish-white, bad-smelling substance (keratin). Do not try to pop or open an epidermal cyst yourself.  What are the causes?   A blocked hair follicle.   A hair that curls and re-enters the skin instead of growing straight out of the skin.   A blocked pore.   Irritated skin.   An injury to the skin.   Certain conditions that are passed along from parent to child (inherited).   Human papillomavirus (HPV).   Long-term sun damage to the skin.  What increases the risk?   Having acne.   Being overweight.   Being 30-40 years old.  What are the signs or symptoms?  These cysts are usually harmless, but they can get infected. Symptoms of infection may include:   Redness.   Inflammation.   Tenderness.   Warmth.   Fever.   A grayish-white, bad-smelling substance drains from the cyst.   Pus drains from the cyst.  How is this treated?  In many cases, epidermal cysts go away on their own without treatment. If a cyst becomes infected, treatment may include:   Opening and draining the cyst, done by a doctor. After draining, you may need minor surgery to remove the rest of the cyst.   Antibiotic medicine.   Shots of medicines (steroids) that help to reduce inflammation.   Surgery to remove the cyst. Surgery may be done if the cyst:  ? Becomes large.  ? Bothers you.  ? Has a chance of turning into cancer.   Do not try to open a cyst yourself.  Follow these instructions at home:   Take over-the-counter and prescription medicines only as told by your doctor.   If you were prescribed an antibiotic medicine, take it it as told by your doctor. Do not stop using the antibiotic even if you start to feel better.   Keep the area around your cyst clean and dry.   Wear loose, dry clothing.   Avoid touching your cyst.   Check your cyst every day for signs of infection. Check for:  ? Redness, swelling, or pain.  ? Fluid  or blood.  ? Warmth.  ? Pus or a bad smell.   Keep all follow-up visits as told by your doctor. This is important.  How is this prevented?   Wear clean, dry, clothing.   Avoid wearing tight clothing.   Keep your skin clean and dry. Take showers or baths every day.  Contact a doctor if:   Your cyst has symptoms of infection.   Your condition does not improve or gets worse.   You have a cyst that looks different from other cysts you have had.   You have a fever.  Get help right away if:   Redness spreads from the cyst into the area close by.  Summary   An epidermal cyst is a sac made of skin tissue.   If a cyst becomes infected, treatment may include surgery to open and drain the cyst, or to remove it.   Take over-the-counter and prescription medicines only as told by your doctor.   Contact a doctor if your condition is not improving or is getting worse.   Keep all follow-up visits as told by your doctor. This is important.  This information is not intended to replace advice given to you by your health care provider. Make sure you discuss any   questions you have with your health care provider.  Document Released: 08/06/2004 Document Revised: 01/10/2018 Document Reviewed: 05/01/2015  Elsevier Interactive Patient Education  2019 [employer]

## 2018-12-28 NOTE — Assessment & Plan Note (Signed)
Most consistent with epidermal cyst which may be infected.  Will treat with doxycyline and I also discussed supportive care.  Sent the following mychart message to patient at the end of visit:  Hi Richard Mcguire, It was great to see you today. It appears you have a yeast infection under your right arm pit- we are treating this with a one time dose of an oral tablet of diflucan along with nystatin powder- try to keep that area dry.  Under your left arm pit, it appears you have an epidermal cyst:  I have sent doxycyline (an antibiotic to your pharmacy for this in case it is infected).  Here is more about epidermal cysts:    Epidermal Cyst  An epidermal cyst is a small, painless lump under your skin. The cyst contains a grayish-white, bad-smelling substance (keratin). Do not try to pop or open an epidermal cyst yourself. What are the causes?  A blocked hair follicle.  A hair that curls and re-enters the skin instead of growing straight out of the skin.  A blocked pore.  Irritated skin.  An injury to the skin.  Certain conditions that are passed along from parent to child (inherited).  Human papillomavirus (HPV).  Long-term sun damage to the skin. What increases the risk?  Having acne.  Being overweight.  Being 61-54 years old. What are the signs or symptoms? These cysts are usually harmless, but they can get infected. Symptoms of infection may include:  Redness.  Inflammation.  Tenderness.  Warmth.  Fever.  A grayish-white, bad-smelling substance drains from the cyst.  Pus drains from the cyst. How is this treated? In many cases, epidermal cysts go away on their own without treatment. If a cyst becomes infected, treatment may include:  Opening and draining the cyst, done by a doctor. After draining, you may need minor surgery to remove the rest of the cyst.  Antibiotic medicine.  Shots of medicines (steroids) that help to reduce inflammation.  Surgery to remove  the cyst. Surgery may be done if the cyst: ? Becomes large. ? Bothers you. ? Has a chance of turning into cancer.  Do not try to open a cyst yourself. Follow these instructions at home:  Take over-the-counter and prescription medicines only as told by your doctor.  If you were prescribed an antibiotic medicine, take it it as told by your doctor. Do not stop using the antibiotic even if you start to feel better.  Keep the area around your cyst clean and dry.  Wear loose, dry clothing.  Avoid touching your cyst.  Check your cyst every day for signs of infection. Check for: ? Redness, swelling, or pain. ? Fluid or blood. ? Warmth. ? Pus or a bad smell.  Keep all follow-up visits as told by your doctor. This is important. How is this prevented?  Wear clean, dry, clothing.  Avoid wearing tight clothing.  Keep your skin clean and dry. Take showers or baths every day. Contact a doctor if:  Your cyst has symptoms of infection.  Your condition does not improve or gets worse.  You have a cyst that looks different from other cysts you have had.  You have a fever. Get help right away if:  Redness spreads from the cyst into the area close by. Summary  An epidermal cyst is a sac made of skin tissue.  If a cyst becomes infected, treatment may include surgery to open and drain the cyst, or to remove it. Take care,  Dr.  A  >25 minutes spent in face to face time with patient, >50% spent in counselling or coordination of care discussing his rash and lump under his contralateral axiallia.

## 2019-01-17 DIAGNOSIS — M545 Low back pain: Secondary | ICD-10-CM | POA: Diagnosis not present

## 2019-01-17 DIAGNOSIS — M47816 Spondylosis without myelopathy or radiculopathy, lumbar region: Secondary | ICD-10-CM | POA: Diagnosis not present

## 2019-01-17 DIAGNOSIS — Z79891 Long term (current) use of opiate analgesic: Secondary | ICD-10-CM | POA: Diagnosis not present

## 2019-01-17 DIAGNOSIS — Z79899 Other long term (current) drug therapy: Secondary | ICD-10-CM | POA: Diagnosis not present

## 2019-01-17 DIAGNOSIS — G894 Chronic pain syndrome: Secondary | ICD-10-CM | POA: Diagnosis not present

## 2019-01-31 DIAGNOSIS — M5440 Lumbago with sciatica, unspecified side: Secondary | ICD-10-CM | POA: Diagnosis not present

## 2019-01-31 DIAGNOSIS — M5136 Other intervertebral disc degeneration, lumbar region: Secondary | ICD-10-CM | POA: Diagnosis not present

## 2019-01-31 DIAGNOSIS — M47816 Spondylosis without myelopathy or radiculopathy, lumbar region: Secondary | ICD-10-CM | POA: Diagnosis not present

## 2019-01-31 DIAGNOSIS — M48062 Spinal stenosis, lumbar region with neurogenic claudication: Secondary | ICD-10-CM | POA: Diagnosis not present

## 2019-02-04 ENCOUNTER — Other Ambulatory Visit: Payer: Self-pay | Admitting: Family Medicine

## 2019-02-14 DIAGNOSIS — M545 Low back pain: Secondary | ICD-10-CM | POA: Diagnosis not present

## 2019-02-14 DIAGNOSIS — M47816 Spondylosis without myelopathy or radiculopathy, lumbar region: Secondary | ICD-10-CM | POA: Diagnosis not present

## 2019-02-14 DIAGNOSIS — G894 Chronic pain syndrome: Secondary | ICD-10-CM | POA: Diagnosis not present

## 2019-03-14 DIAGNOSIS — Z79891 Long term (current) use of opiate analgesic: Secondary | ICD-10-CM | POA: Diagnosis not present

## 2019-03-14 DIAGNOSIS — M545 Low back pain: Secondary | ICD-10-CM | POA: Diagnosis not present

## 2019-03-14 DIAGNOSIS — M47816 Spondylosis without myelopathy or radiculopathy, lumbar region: Secondary | ICD-10-CM | POA: Diagnosis not present

## 2019-03-14 DIAGNOSIS — G894 Chronic pain syndrome: Secondary | ICD-10-CM | POA: Diagnosis not present

## 2019-03-14 DIAGNOSIS — Z79899 Other long term (current) drug therapy: Secondary | ICD-10-CM | POA: Diagnosis not present

## 2019-04-11 DIAGNOSIS — M47816 Spondylosis without myelopathy or radiculopathy, lumbar region: Secondary | ICD-10-CM | POA: Diagnosis not present

## 2019-04-11 DIAGNOSIS — M545 Low back pain: Secondary | ICD-10-CM | POA: Diagnosis not present

## 2019-04-11 DIAGNOSIS — G894 Chronic pain syndrome: Secondary | ICD-10-CM | POA: Diagnosis not present

## 2019-05-09 ENCOUNTER — Other Ambulatory Visit: Payer: Self-pay | Admitting: Family Medicine

## 2019-05-09 DIAGNOSIS — M545 Low back pain: Secondary | ICD-10-CM | POA: Diagnosis not present

## 2019-05-09 DIAGNOSIS — Z79891 Long term (current) use of opiate analgesic: Secondary | ICD-10-CM | POA: Diagnosis not present

## 2019-05-09 DIAGNOSIS — G894 Chronic pain syndrome: Secondary | ICD-10-CM | POA: Diagnosis not present

## 2019-05-09 DIAGNOSIS — M47816 Spondylosis without myelopathy or radiculopathy, lumbar region: Secondary | ICD-10-CM | POA: Diagnosis not present

## 2019-05-09 DIAGNOSIS — Z79899 Other long term (current) drug therapy: Secondary | ICD-10-CM | POA: Diagnosis not present

## 2019-05-16 ENCOUNTER — Telehealth: Payer: Self-pay

## 2019-05-16 NOTE — Patient Instructions (Addendum)
Great to see you.  Miconazole 2% along with triamincilone can use up to twice daily, you can cover in the nystatin powder.  In order to wean off cymbalta- Start taking 60 mg every other day for 2 weeks. Then take 30 mg every other day for 2 weeks.  See how you feel.   Go ahead and start the Paxil 20 mg daily.  Keep me updated.

## 2019-05-16 NOTE — Telephone Encounter (Signed)
Travel or Contacts:   Any travel in the past 2 weeks? No  Have you came in contact with anyone who has Covid? No  Have you had a positive Covid test? If so, when?No Fever >100.56F []   Yes [x]   No []   Unknown  Chills []   Yes [x]   No []   Unknown  Muscle aches (myalgia) []   Yes [x]   No []   Unknown  Runny nose (rhinorrhea) []   Yes [x]   No []   Unknown  Sore throat []   Yes [x]   No []   Unknown Cough (new onset or worsening of chronic cough) []   Yes [x]   No []   Unknown  Shortness of breath (dyspnea) []   Yes [x]   No []   Unknown Nausea or vomiting []   Yes [x]   No []   Unknown  Headache []   Yes [x]   No []   Unknown  Abdominal pain  []   Yes [x]   No []   Unknown  Diarrhea (?3 loose/looser than normal stools/24hr period) []   Yes [x]   No []   Unknown Other, s:pecify

## 2019-05-17 ENCOUNTER — Ambulatory Visit (INDEPENDENT_AMBULATORY_CARE_PROVIDER_SITE_OTHER): Payer: Medicare Other | Admitting: Family Medicine

## 2019-05-17 ENCOUNTER — Encounter: Payer: Self-pay | Admitting: Family Medicine

## 2019-05-17 ENCOUNTER — Other Ambulatory Visit: Payer: Self-pay

## 2019-05-17 VITALS — BP 110/80 | HR 95 | Temp 99.1°F | Ht 73.0 in | Wt 346.4 lb

## 2019-05-17 DIAGNOSIS — Z23 Encounter for immunization: Secondary | ICD-10-CM

## 2019-05-17 DIAGNOSIS — R7989 Other specified abnormal findings of blood chemistry: Secondary | ICD-10-CM

## 2019-05-17 DIAGNOSIS — B3742 Candidal balanitis: Secondary | ICD-10-CM | POA: Diagnosis not present

## 2019-05-17 DIAGNOSIS — I10 Essential (primary) hypertension: Secondary | ICD-10-CM

## 2019-05-17 DIAGNOSIS — F321 Major depressive disorder, single episode, moderate: Secondary | ICD-10-CM

## 2019-05-17 DIAGNOSIS — Z125 Encounter for screening for malignant neoplasm of prostate: Secondary | ICD-10-CM | POA: Diagnosis not present

## 2019-05-17 MED ORDER — SUMATRIPTAN SUCCINATE 50 MG PO TABS: ORAL_TABLET | ORAL | 3 refills | Status: AC

## 2019-05-17 MED ORDER — MICONAZOLE NITRATE 2 % EX CREA: 1.0000 "application " | TOPICAL_CREAM | Freq: Two times a day (BID) | CUTANEOUS | 0 refills | Status: AC

## 2019-05-17 MED ORDER — DULOXETINE HCL 60 MG PO CPEP: ORAL_CAPSULE | ORAL | 0 refills | Status: AC

## 2019-05-17 MED ORDER — DULOXETINE HCL 30 MG PO CPEP: ORAL_CAPSULE | ORAL | 3 refills | Status: AC

## 2019-05-17 MED ORDER — PAROXETINE HCL 20 MG PO TABS: 20.0000 mg | ORAL_TABLET | Freq: Every day | ORAL | 3 refills | Status: DC

## 2019-05-17 MED ORDER — TRIAMCINOLONE ACETONIDE 0.1 % EX CREA: 1.0000 "application " | TOPICAL_CREAM | Freq: Two times a day (BID) | CUTANEOUS | 0 refills | Status: AC

## 2019-05-17 NOTE — Progress Notes (Signed)
 Subjective:   Patient ID: Richard Mcguire, male    DOB: 02/22/1971, 48 y.o.   MRN: [id number]  Richard Mcguire is a pleasant 48 y.o. year old male who presents to clinic today with No chief complaint on file.  on 05/17/2019  HPI:   Balanitis- saw urologist years ago, candidal.  Lotrisone works but only for short period of time.   Depression- deteriorated.  We increased his cymbalta to 120 mg daily based on his pain doctor's recommendation to help with pain.  Weaned him off paxil. It has not helped with pain or depression.  Felt paxil helped much more.  Denies true suicidal thoughts- could never do that to his grand kids.  Has no plan.  Depression screen PHQ 2/9 05/17/2019 09/08/2018 08/01/2018 01/17/2018 12/15/2017  Decreased Interest 2 2 3 2 3  Down, Depressed, Hopeless 3 2 3 3 3  PHQ - 2 Score 5 4 6 5 6  Altered sleeping 2 1 2 2 3  Tired, decreased energy 2 2 3 2 3  Change in appetite 1 1 1 2 3  Feeling bad or failure about yourself  2 2 3 3 3  Trouble concentrating 1 1 3 0 3  Moving slowly or fidgety/restless 1 1 0 0 3  Suicidal thoughts 2 1 1 1 0  PHQ-9 Score 16 13 19 15 24  Difficult doing work/chores Somewhat difficult Somewhat difficult Very difficult Very difficult Somewhat difficult   GAD 7 : Generalized Anxiety Score 05/17/2019 09/08/2018 08/01/2018 01/17/2018  Nervous, Anxious, on Edge 2 1 3 1  Control/stop worrying 1 2 2 3  Worry too much - different things 2 2 2 3  Trouble relaxing 2 3 2 2  Restless 1 0 1 1  Easily annoyed or irritable 2 1 2 1  Afraid - awful might happen 1 0 2 2  Total GAD 7 Score 11 9 14 13  Anxiety Difficulty Somewhat difficult Somewhat difficult Very difficult Very difficult      Lab Results  Component Value Date   TESTOSTERONE 104.79 (L) 09/08/2018   No results found for: HGBA1C  Review of Systems  Constitutional: Negative.   HENT: Negative.   Respiratory: Negative.   Cardiovascular: Negative.   Gastrointestinal: Negative.    Endocrine: Negative.   Genitourinary: Positive for penile pain.  Musculoskeletal: Negative.   Allergic/Immunologic: Negative.   Neurological: Negative.   Hematological: Negative.   Psychiatric/Behavioral: Negative.   All other systems reviewed and are negative.      Objective:    BP 110/80 (BP Location: Left Arm, Patient Position: Sitting, Cuff Size: Large)   Pulse 95   Temp 99.1 F (37.3 C) (Oral)   Ht 6' 1" (1.854 m)   Wt (!) 346 lb 6.4 oz (157.1 kg)   SpO2 97%   BMI 45.70 kg/m    Physical Exam Vitals signs and nursing note reviewed.  Constitutional:      Appearance: He is obese.  HENT:     Head: Normocephalic and atraumatic.     Right Ear: External ear normal.     Left Ear: External ear normal.     Mouth/Throat:     Mouth: Mucous membranes are moist.  Eyes:     Extraocular Movements: Extraocular movements intact.  Neck:     Musculoskeletal: Normal range of motion.  Cardiovascular:     Rate and Rhythm: Normal rate.     Pulses: Normal pulses.  Pulmonary:     Effort: Pulmonary effort   is normal.     Breath sounds: Normal breath sounds.  Abdominal:     General: Abdomen is flat.  Musculoskeletal: Normal range of motion.  Skin:    General: Skin is warm and dry.  Neurological:     General: No focal deficit present.     Mental Status: He is alert.  Psychiatric:        Attention and Perception: Attention and perception normal.        Mood and Affect: Mood and affect normal.        Speech: Speech normal.        Behavior: Behavior normal.        Thought Content: Thought content normal.        Cognition and Memory: Cognition normal.        Judgment: Judgment normal.           Assessment & Plan:   Low testosterone - Plan: Testosterone, free, Testosterone, Ambulatory referral to Urology  Essential hypertension - Plan: Comp Met (CMET), CBC w/Diff  Screening for malignant neoplasm of prostate - Plan: PSA  Need for immunization against influenza - Plan:  Flu Vaccine QUAD 6+ mos PF IM (Fluarix Quad PF) No follow-ups on file.

## 2019-05-17 NOTE — Assessment & Plan Note (Signed)
>  40 minutes spent in face to face time with patient, >50% spent in counselling or coordination of care discussing depression and balanitis. He is contracted for safety. Wean off of cymbalta as it is not helping with pain or depression and start paxil as detailed in AVS below:  In order to wean off cymbalta- Start taking 60 mg every other day for 2 weeks. Then take 30 mg every other day for 2 weeks.  See how you feel.   Go ahead and start the Paxil 20 mg daily.  Call or send my chart message prn if these symptoms worsen or fail to improve as anticipated. The patient indicates understanding of these issues and agrees with the plan.     Office Visit from 05/17/2019 in [facility]  PHQ-9 Total Score  16     GAD 7 : Generalized Anxiety Score 05/17/2019 09/08/2018 08/01/2018 01/17/2018  Nervous, Anxious, on Edge 2 1 3 1   Control/stop worrying 1 2 2 3   Worry too much - different things 2 2 2 3   Trouble relaxing 2 3 2 2   Restless 1 0 1 1  Easily annoyed or irritable 2 1 2 1   Afraid - awful might happen 1 0 2 2  Total GAD 7 Score 11 9 14 13   Anxiety Difficulty Somewhat difficult Somewhat difficult Very difficult Very difficult

## 2019-05-17 NOTE — Assessment & Plan Note (Signed)
Discussed tx options- losing weight will help, not wearing tight pants or underwear. Will try miconazole 2% with triamcinolone as needed.  Also can apply nystatin powder over it to keep it dry. Currently asymptomatic.  eRXs sent. Call or send my chart message prn if these symptoms worsen or fail to improve as anticipated. The patient indicates understanding of these issues and agrees with the plan.

## 2019-05-24 ENCOUNTER — Ambulatory Visit: Payer: Medicare Other | Admitting: Family Medicine

## 2019-06-06 DIAGNOSIS — M545 Low back pain: Secondary | ICD-10-CM | POA: Diagnosis not present

## 2019-06-06 DIAGNOSIS — G894 Chronic pain syndrome: Secondary | ICD-10-CM | POA: Diagnosis not present

## 2019-06-06 DIAGNOSIS — M47816 Spondylosis without myelopathy or radiculopathy, lumbar region: Secondary | ICD-10-CM | POA: Diagnosis not present

## 2019-06-10 ENCOUNTER — Other Ambulatory Visit: Payer: Self-pay | Admitting: Family Medicine

## 2019-07-18 DIAGNOSIS — Z79899 Other long term (current) drug therapy: Secondary | ICD-10-CM | POA: Diagnosis not present

## 2019-07-18 DIAGNOSIS — M47816 Spondylosis without myelopathy or radiculopathy, lumbar region: Secondary | ICD-10-CM | POA: Diagnosis not present

## 2019-07-18 DIAGNOSIS — M545 Low back pain: Secondary | ICD-10-CM | POA: Diagnosis not present

## 2019-07-18 DIAGNOSIS — Z79891 Long term (current) use of opiate analgesic: Secondary | ICD-10-CM | POA: Diagnosis not present

## 2019-07-18 DIAGNOSIS — G894 Chronic pain syndrome: Secondary | ICD-10-CM | POA: Diagnosis not present

## 2019-08-15 DIAGNOSIS — M545 Low back pain: Secondary | ICD-10-CM | POA: Diagnosis not present

## 2019-08-15 DIAGNOSIS — G894 Chronic pain syndrome: Secondary | ICD-10-CM | POA: Diagnosis not present

## 2019-08-15 DIAGNOSIS — M47816 Spondylosis without myelopathy or radiculopathy, lumbar region: Secondary | ICD-10-CM | POA: Diagnosis not present

## 2019-09-12 DIAGNOSIS — G894 Chronic pain syndrome: Secondary | ICD-10-CM | POA: Diagnosis not present

## 2019-09-12 DIAGNOSIS — M47816 Spondylosis without myelopathy or radiculopathy, lumbar region: Secondary | ICD-10-CM | POA: Diagnosis not present

## 2019-09-12 DIAGNOSIS — M545 Low back pain: Secondary | ICD-10-CM | POA: Diagnosis not present

## 2019-10-23 DIAGNOSIS — G43019 Migraine without aura, intractable, without status migrainosus: Secondary | ICD-10-CM | POA: Diagnosis not present

## 2019-10-23 DIAGNOSIS — G4733 Obstructive sleep apnea (adult) (pediatric): Secondary | ICD-10-CM | POA: Diagnosis not present

## 2019-10-23 DIAGNOSIS — I10 Essential (primary) hypertension: Secondary | ICD-10-CM | POA: Diagnosis not present

## 2019-10-23 DIAGNOSIS — M5126 Other intervertebral disc displacement, lumbar region: Secondary | ICD-10-CM | POA: Diagnosis not present

## 2019-10-23 DIAGNOSIS — Z0189 Encounter for other specified special examinations: Secondary | ICD-10-CM | POA: Diagnosis not present

## 2019-10-24 DIAGNOSIS — M47816 Spondylosis without myelopathy or radiculopathy, lumbar region: Secondary | ICD-10-CM | POA: Diagnosis not present

## 2019-10-24 DIAGNOSIS — M545 Low back pain: Secondary | ICD-10-CM | POA: Diagnosis not present

## 2019-10-24 DIAGNOSIS — G894 Chronic pain syndrome: Secondary | ICD-10-CM | POA: Diagnosis not present

## 2019-10-31 DIAGNOSIS — R39198 Other difficulties with micturition: Secondary | ICD-10-CM | POA: Diagnosis not present

## 2019-10-31 DIAGNOSIS — N35919 Unspecified urethral stricture, male, unspecified site: Secondary | ICD-10-CM | POA: Diagnosis not present

## 2019-11-03 DIAGNOSIS — M47816 Spondylosis without myelopathy or radiculopathy, lumbar region: Secondary | ICD-10-CM | POA: Diagnosis not present

## 2019-11-03 DIAGNOSIS — M5442 Lumbago with sciatica, left side: Secondary | ICD-10-CM | POA: Diagnosis not present

## 2019-11-03 DIAGNOSIS — M5416 Radiculopathy, lumbar region: Secondary | ICD-10-CM | POA: Diagnosis not present

## 2019-11-03 DIAGNOSIS — M5136 Other intervertebral disc degeneration, lumbar region: Secondary | ICD-10-CM | POA: Diagnosis not present

## 2019-11-21 DIAGNOSIS — M545 Low back pain: Secondary | ICD-10-CM | POA: Diagnosis not present

## 2019-11-21 DIAGNOSIS — G894 Chronic pain syndrome: Secondary | ICD-10-CM | POA: Diagnosis not present

## 2019-11-21 DIAGNOSIS — Z79891 Long term (current) use of opiate analgesic: Secondary | ICD-10-CM | POA: Diagnosis not present

## 2019-11-21 DIAGNOSIS — Z79899 Other long term (current) drug therapy: Secondary | ICD-10-CM | POA: Diagnosis not present

## 2019-11-21 DIAGNOSIS — M47816 Spondylosis without myelopathy or radiculopathy, lumbar region: Secondary | ICD-10-CM | POA: Diagnosis not present

## 2019-12-07 DIAGNOSIS — R39198 Other difficulties with micturition: Secondary | ICD-10-CM | POA: Diagnosis not present

## 2019-12-07 DIAGNOSIS — N35919 Unspecified urethral stricture, male, unspecified site: Secondary | ICD-10-CM | POA: Diagnosis not present

## 2019-12-12 DIAGNOSIS — I10 Essential (primary) hypertension: Secondary | ICD-10-CM | POA: Diagnosis not present

## 2019-12-12 DIAGNOSIS — G43019 Migraine without aura, intractable, without status migrainosus: Secondary | ICD-10-CM | POA: Diagnosis not present

## 2019-12-12 DIAGNOSIS — G4733 Obstructive sleep apnea (adult) (pediatric): Secondary | ICD-10-CM | POA: Diagnosis not present

## 2019-12-12 DIAGNOSIS — Z0000 Encounter for general adult medical examination without abnormal findings: Secondary | ICD-10-CM | POA: Diagnosis not present

## 2019-12-12 DIAGNOSIS — G894 Chronic pain syndrome: Secondary | ICD-10-CM | POA: Diagnosis not present

## 2019-12-14 DIAGNOSIS — I10 Essential (primary) hypertension: Secondary | ICD-10-CM | POA: Diagnosis not present

## 2019-12-14 DIAGNOSIS — R7309 Other abnormal glucose: Secondary | ICD-10-CM | POA: Diagnosis not present

## 2019-12-14 DIAGNOSIS — M5030 Other cervical disc degeneration, unspecified cervical region: Secondary | ICD-10-CM | POA: Diagnosis not present

## 2019-12-14 DIAGNOSIS — R5382 Chronic fatigue, unspecified: Secondary | ICD-10-CM | POA: Diagnosis not present

## 2019-12-14 DIAGNOSIS — M47812 Spondylosis without myelopathy or radiculopathy, cervical region: Secondary | ICD-10-CM | POA: Diagnosis not present

## 2019-12-19 DIAGNOSIS — M47812 Spondylosis without myelopathy or radiculopathy, cervical region: Secondary | ICD-10-CM | POA: Diagnosis not present

## 2019-12-19 DIAGNOSIS — M542 Cervicalgia: Secondary | ICD-10-CM | POA: Diagnosis not present

## 2019-12-19 DIAGNOSIS — G894 Chronic pain syndrome: Secondary | ICD-10-CM | POA: Diagnosis not present

## 2019-12-19 DIAGNOSIS — M47816 Spondylosis without myelopathy or radiculopathy, lumbar region: Secondary | ICD-10-CM | POA: Diagnosis not present

## 2020-01-22 DIAGNOSIS — M5030 Other cervical disc degeneration, unspecified cervical region: Secondary | ICD-10-CM | POA: Diagnosis not present

## 2020-01-22 DIAGNOSIS — M5136 Other intervertebral disc degeneration, lumbar region: Secondary | ICD-10-CM | POA: Diagnosis not present

## 2020-01-22 DIAGNOSIS — M47812 Spondylosis without myelopathy or radiculopathy, cervical region: Secondary | ICD-10-CM | POA: Diagnosis not present

## 2020-01-22 DIAGNOSIS — M47817 Spondylosis without myelopathy or radiculopathy, lumbosacral region: Secondary | ICD-10-CM | POA: Diagnosis not present

## 2020-01-29 DIAGNOSIS — N35919 Unspecified urethral stricture, male, unspecified site: Secondary | ICD-10-CM | POA: Diagnosis not present

## 2020-01-29 DIAGNOSIS — I10 Essential (primary) hypertension: Secondary | ICD-10-CM | POA: Diagnosis not present

## 2020-01-29 DIAGNOSIS — R319 Hematuria, unspecified: Secondary | ICD-10-CM | POA: Diagnosis not present

## 2020-01-29 DIAGNOSIS — M1990 Unspecified osteoarthritis, unspecified site: Secondary | ICD-10-CM | POA: Diagnosis not present

## 2020-01-29 DIAGNOSIS — N35911 Unspecified urethral stricture, male, meatal: Secondary | ICD-10-CM | POA: Diagnosis not present

## 2020-01-29 DIAGNOSIS — Z79899 Other long term (current) drug therapy: Secondary | ICD-10-CM | POA: Diagnosis not present

## 2020-01-29 DIAGNOSIS — N35914 Unspecified anterior urethral stricture, male: Secondary | ICD-10-CM | POA: Diagnosis not present

## 2020-01-29 DIAGNOSIS — Z87891 Personal history of nicotine dependence: Secondary | ICD-10-CM | POA: Diagnosis not present

## 2020-01-29 DIAGNOSIS — G4733 Obstructive sleep apnea (adult) (pediatric): Secondary | ICD-10-CM | POA: Diagnosis not present

## 2020-02-27 DIAGNOSIS — M47812 Spondylosis without myelopathy or radiculopathy, cervical region: Secondary | ICD-10-CM | POA: Diagnosis not present

## 2020-02-27 DIAGNOSIS — M542 Cervicalgia: Secondary | ICD-10-CM | POA: Diagnosis not present

## 2020-02-27 DIAGNOSIS — Z79891 Long term (current) use of opiate analgesic: Secondary | ICD-10-CM | POA: Diagnosis not present

## 2020-02-27 DIAGNOSIS — Z79899 Other long term (current) drug therapy: Secondary | ICD-10-CM | POA: Diagnosis not present

## 2020-02-27 DIAGNOSIS — M47816 Spondylosis without myelopathy or radiculopathy, lumbar region: Secondary | ICD-10-CM | POA: Diagnosis not present

## 2020-02-27 DIAGNOSIS — G894 Chronic pain syndrome: Secondary | ICD-10-CM | POA: Diagnosis not present

## 2020-06-20 ENCOUNTER — Other Ambulatory Visit: Payer: Self-pay | Admitting: Neurosurgery

## 2020-06-20 DIAGNOSIS — M5442 Lumbago with sciatica, left side: Secondary | ICD-10-CM

## 2020-06-20 DIAGNOSIS — M546 Pain in thoracic spine: Secondary | ICD-10-CM

## 2020-06-20 DIAGNOSIS — M5412 Radiculopathy, cervical region: Secondary | ICD-10-CM

## 2020-06-20 DIAGNOSIS — G8929 Other chronic pain: Secondary | ICD-10-CM

## 2020-07-18 ENCOUNTER — Ambulatory Visit
Admission: RE | Admit: 2020-07-18 | Discharge: 2020-07-18 | Disposition: A | Discharge status: Home / Self Care | Payer: Medicare Other | Source: Ambulatory Visit | Attending: Neurosurgery | Admitting: Neurosurgery

## 2020-07-18 DIAGNOSIS — M5412 Radiculopathy, cervical region: Secondary | ICD-10-CM

## 2020-07-18 DIAGNOSIS — G8929 Other chronic pain: Secondary | ICD-10-CM

## 2020-07-18 DIAGNOSIS — M546 Pain in thoracic spine: Secondary | ICD-10-CM

## 2020-07-18 MED ORDER — GADOBENATE DIMEGLUMINE 529 MG/ML IV SOLN
20.0000 mL | Freq: Once | INTRAVENOUS | Status: AC | PRN
  Administered 2020-07-18: 20 mL via INTRAVENOUS

## 2020-09-19 ENCOUNTER — Telehealth: Payer: Self-pay

## 2020-09-19 NOTE — Telephone Encounter (Signed)
Call made to patient, confirmed DOB. Received verbal from patient that it is okay to send records.   HST printed and faxed.   Call made to [facility] left message on voicemail making them aware the fax is on it's way.

## 2022-05-21 ENCOUNTER — Other Ambulatory Visit: Payer: Self-pay

## 2022-05-21 DIAGNOSIS — M5412 Radiculopathy, cervical region: Secondary | ICD-10-CM

## 2022-05-25 ENCOUNTER — Other Ambulatory Visit: Payer: Self-pay | Admitting: Student

## 2022-05-25 DIAGNOSIS — M5412 Radiculopathy, cervical region: Secondary | ICD-10-CM

## 2022-06-13 ENCOUNTER — Ambulatory Visit
Admission: RE | Admit: 2022-06-13 | Discharge: 2022-06-13 | Disposition: A | Discharge status: Home / Self Care | Payer: Medicare Other | Source: Ambulatory Visit | Attending: Student | Admitting: Student

## 2022-06-13 DIAGNOSIS — M5412 Radiculopathy, cervical region: Secondary | ICD-10-CM
